# Patient Record
Sex: Male | Born: 1975 | Race: White | Hispanic: No | Marital: Single | Smoking: Never smoker
Health system: Southern US, Community
[De-identification: ages and names within clinical notes are randomized; demographics above are authoritative.]

## PROBLEM LIST (undated history)

## (undated) DIAGNOSIS — F319 Bipolar disorder, unspecified: Secondary | ICD-10-CM

## (undated) DIAGNOSIS — R569 Unspecified convulsions: Secondary | ICD-10-CM

---

## 2017-03-08 ENCOUNTER — Inpatient Hospital Stay (HOSPITAL_COMMUNITY)
Admission: EM | Admit: 2017-03-08 | Discharge: 2017-03-14 | DRG: 101 | Disposition: A | Discharge status: Home / Self Care | Payer: Self-pay | Attending: Family Medicine | Admitting: Family Medicine

## 2017-03-08 ENCOUNTER — Encounter (HOSPITAL_COMMUNITY): Payer: Self-pay | Admitting: Emergency Medicine

## 2017-03-08 ENCOUNTER — Emergency Department (HOSPITAL_COMMUNITY): Payer: Self-pay

## 2017-03-08 DIAGNOSIS — M4802 Spinal stenosis, cervical region: Secondary | ICD-10-CM | POA: Diagnosis present

## 2017-03-08 DIAGNOSIS — Z227 Latent tuberculosis: Secondary | ICD-10-CM

## 2017-03-08 DIAGNOSIS — G9340 Encephalopathy, unspecified: Secondary | ICD-10-CM

## 2017-03-08 DIAGNOSIS — G621 Alcoholic polyneuropathy: Secondary | ICD-10-CM | POA: Diagnosis present

## 2017-03-08 DIAGNOSIS — I471 Supraventricular tachycardia: Secondary | ICD-10-CM | POA: Diagnosis present

## 2017-03-08 DIAGNOSIS — Z9114 Patient's other noncompliance with medication regimen: Secondary | ICD-10-CM

## 2017-03-08 DIAGNOSIS — K7460 Unspecified cirrhosis of liver: Secondary | ICD-10-CM

## 2017-03-08 DIAGNOSIS — F10239 Alcohol dependence with withdrawal, unspecified: Secondary | ICD-10-CM | POA: Diagnosis present

## 2017-03-08 DIAGNOSIS — R7611 Nonspecific reaction to tuberculin skin test without active tuberculosis: Secondary | ICD-10-CM | POA: Diagnosis present

## 2017-03-08 DIAGNOSIS — F10230 Alcohol dependence with withdrawal, uncomplicated: Secondary | ICD-10-CM

## 2017-03-08 DIAGNOSIS — F319 Bipolar disorder, unspecified: Secondary | ICD-10-CM | POA: Diagnosis present

## 2017-03-08 DIAGNOSIS — K7469 Other cirrhosis of liver: Secondary | ICD-10-CM

## 2017-03-08 DIAGNOSIS — G629 Polyneuropathy, unspecified: Secondary | ICD-10-CM

## 2017-03-08 DIAGNOSIS — Z79899 Other long term (current) drug therapy: Secondary | ICD-10-CM

## 2017-03-08 DIAGNOSIS — F410 Panic disorder [episodic paroxysmal anxiety] without agoraphobia: Secondary | ICD-10-CM | POA: Diagnosis not present

## 2017-03-08 DIAGNOSIS — D696 Thrombocytopenia, unspecified: Secondary | ICD-10-CM

## 2017-03-08 DIAGNOSIS — E871 Hypo-osmolality and hyponatremia: Secondary | ICD-10-CM | POA: Diagnosis present

## 2017-03-08 DIAGNOSIS — G40509 Epileptic seizures related to external causes, not intractable, without status epilepticus: Principal | ICD-10-CM | POA: Diagnosis present

## 2017-03-08 DIAGNOSIS — E7220 Disorder of urea cycle metabolism, unspecified: Secondary | ICD-10-CM

## 2017-03-08 DIAGNOSIS — F10930 Alcohol use, unspecified with withdrawal, uncomplicated: Secondary | ICD-10-CM

## 2017-03-08 DIAGNOSIS — R569 Unspecified convulsions: Secondary | ICD-10-CM

## 2017-03-08 DIAGNOSIS — Z23 Encounter for immunization: Secondary | ICD-10-CM

## 2017-03-08 DIAGNOSIS — K7030 Alcoholic cirrhosis of liver without ascites: Secondary | ICD-10-CM

## 2017-03-08 DIAGNOSIS — F10129 Alcohol abuse with intoxication, unspecified: Secondary | ICD-10-CM

## 2017-03-08 DIAGNOSIS — Z87820 Personal history of traumatic brain injury: Secondary | ICD-10-CM

## 2017-03-08 DIAGNOSIS — Z590 Homelessness: Secondary | ICD-10-CM

## 2017-03-08 DIAGNOSIS — D61818 Other pancytopenia: Secondary | ICD-10-CM | POA: Diagnosis present

## 2017-03-08 HISTORY — DX: Bipolar disorder, unspecified: F31.9

## 2017-03-08 HISTORY — DX: Unspecified convulsions: R56.9

## 2017-03-08 LAB — CBC
HCT: 38.6 % — ABNORMAL LOW (ref 39.0–52.0)
HCT: 36.4 % — ABNORMAL LOW (ref 39.0–52.0)
HEMOGLOBIN: 13.4 g/dL (ref 13.0–17.0)
HEMOGLOBIN: 12.4 g/dL — AB (ref 13.0–17.0)
MCH: 32.4 pg (ref 26.0–34.0)
MCH: 32 pg (ref 26.0–34.0)
MCHC: 34.7 g/dL (ref 30.0–36.0)
MCHC: 34.1 g/dL (ref 30.0–36.0)
MCV: 93.2 fL (ref 78.0–100.0)
MCV: 93.8 fL (ref 78.0–100.0)
Platelets: 44 10*3/uL — ABNORMAL LOW (ref 150–400)
Platelets: 32 10*3/uL — ABNORMAL LOW (ref 150–400)
RBC: 4.14 MIL/uL — AB (ref 4.22–5.81)
RBC: 3.88 MIL/uL — AB (ref 4.22–5.81)
RDW: 15.4 % (ref 11.5–15.5)
RDW: 15.4 % (ref 11.5–15.5)
WBC: 3.8 10*3/uL — AB (ref 4.0–10.5)
WBC: 2.4 10*3/uL — ABNORMAL LOW (ref 4.0–10.5)

## 2017-03-08 LAB — COMPREHENSIVE METABOLIC PANEL
ALT: 105 U/L — AB (ref 17–63)
AST: 198 U/L — ABNORMAL HIGH (ref 15–41)
Albumin: 4 g/dL (ref 3.5–5.0)
Alkaline Phosphatase: 253 U/L — ABNORMAL HIGH (ref 38–126)
Anion gap: 11 (ref 5–15)
BILIRUBIN TOTAL: 1.8 mg/dL — AB (ref 0.3–1.2)
CO2: 24 mmol/L (ref 22–32)
CREATININE: 0.59 mg/dL — AB (ref 0.61–1.24)
Calcium: 8.5 mg/dL — ABNORMAL LOW (ref 8.9–10.3)
Chloride: 99 mmol/L — ABNORMAL LOW (ref 101–111)
GFR calc Af Amer: >60 mL/min (ref >60)
GFR calc non Af Amer: >60 mL/min (ref >60)
Glucose, Bld: 102 mg/dL — ABNORMAL HIGH (ref 65–99)
Potassium: 3.9 mmol/L (ref 3.5–5.1)
Sodium: 134 mmol/L — ABNORMAL LOW (ref 135–145)
TOTAL PROTEIN: 7 g/dL (ref 6.5–8.1)

## 2017-03-08 LAB — CREATININE, SERUM
CREATININE: 0.58 mg/dL — AB (ref 0.61–1.24)
GFR calc Af Amer: >60 mL/min (ref >60)

## 2017-03-08 LAB — MAGNESIUM
MAGNESIUM: 1.8 mg/dL (ref 1.7–2.4)
MAGNESIUM: 1.7 mg/dL (ref 1.7–2.4)

## 2017-03-08 LAB — CK: Total CK: 193 U/L (ref 49–397)

## 2017-03-08 LAB — PHOSPHORUS
PHOSPHORUS: 4.8 mg/dL — AB (ref 2.5–4.6)
PHOSPHORUS: 4.7 mg/dL — AB (ref 2.5–4.6)

## 2017-03-08 LAB — VITAMIN B12: Vitamin B-12: 519 pg/mL (ref 180–914)

## 2017-03-08 LAB — PROTIME-INR
INR: 1.14
Prothrombin Time: 14.6 seconds (ref 11.4–15.2)

## 2017-03-08 LAB — TROPONIN I: Troponin I: <0.03 ng/mL (ref <0.03)

## 2017-03-08 LAB — BRAIN NATRIURETIC PEPTIDE: B Natriuretic Peptide: 18 pg/mL (ref 0.0–100.0)

## 2017-03-08 LAB — AMMONIA: Ammonia: 83 umol/L — ABNORMAL HIGH (ref 9–35)

## 2017-03-08 LAB — LIPASE, BLOOD: LIPASE: 31 U/L (ref 11–51)

## 2017-03-08 LAB — CBG MONITORING, ED: Glucose-Capillary: 113 mg/dL — ABNORMAL HIGH (ref 65–99)

## 2017-03-08 LAB — ETHANOL: Alcohol, Ethyl (B): 522 mg/dL (ref <5)

## 2017-03-08 LAB — MRSA PCR SCREENING: MRSA BY PCR: NEGATIVE

## 2017-03-08 MED ORDER — ADULT MULTIVITAMIN W/MINERALS CH
1.0000 | ORAL_TABLET | Freq: Every day | ORAL | Status: DC
  Administered 2017-03-09 – 2017-03-14 (×6): 1 via ORAL
  Filled 2017-03-08 (×6): qty 1

## 2017-03-08 MED ORDER — VITAMIN B-1 100 MG PO TABS: 100.0000 mg | ORAL_TABLET | Freq: Every day | ORAL | Status: DC

## 2017-03-08 MED ORDER — LORAZEPAM 2 MG/ML IJ SOLN
2.0000 mg | INTRAMUSCULAR | Status: DC | PRN
  Administered 2017-03-08 – 2017-03-14 (×23): 2 mg via INTRAVENOUS
  Filled 2017-03-08 (×25): qty 1

## 2017-03-08 MED ORDER — M.V.I. ADULT IV INJ
INJECTION | Freq: Once | INTRAVENOUS | Status: AC
  Administered 2017-03-08: 15:00:00 via INTRAVENOUS
  Filled 2017-03-08 (×2): qty 1000

## 2017-03-08 MED ORDER — KCL IN DEXTROSE-NACL 20-5-0.45 MEQ/L-%-% IV SOLN
INTRAVENOUS | Status: DC
  Administered 2017-03-08: 18:00:00 via INTRAVENOUS
  Filled 2017-03-08: qty 1000

## 2017-03-08 MED ORDER — LACTULOSE 10 GM/15ML PO SOLN
30.0000 g | Freq: Once | ORAL | Status: AC
  Administered 2017-03-08: 30 g via ORAL
  Filled 2017-03-08 (×2): qty 45

## 2017-03-08 MED ORDER — FOLIC ACID 5 MG/ML IJ SOLN
1.0000 mg | Freq: Every day | INTRAMUSCULAR | Status: DC
  Administered 2017-03-08: 1 mg via INTRAVENOUS
  Filled 2017-03-08: qty 0.2

## 2017-03-08 MED ORDER — THIAMINE HCL 100 MG/ML IJ SOLN
100.0000 mg | Freq: Every day | INTRAMUSCULAR | Status: DC
  Administered 2017-03-08: 100 mg via INTRAVENOUS
  Filled 2017-03-08: qty 2

## 2017-03-08 MED ORDER — PNEUMOCOCCAL VAC POLYVALENT 25 MCG/0.5ML IJ INJ
0.5000 mL | INJECTION | INTRAMUSCULAR | Status: AC
  Administered 2017-03-09: 0.5 mL via INTRAMUSCULAR
  Filled 2017-03-08: qty 0.5

## 2017-03-08 MED ORDER — SODIUM CHLORIDE 0.9 % IV SOLN
500.0000 mg | Freq: Two times a day (BID) | INTRAVENOUS | Status: DC
  Administered 2017-03-09 – 2017-03-14 (×11): 500 mg via INTRAVENOUS
  Filled 2017-03-08 (×14): qty 5

## 2017-03-08 MED ORDER — POTASSIUM CHLORIDE IN NACL 20-0.9 MEQ/L-% IV SOLN
INTRAVENOUS | Status: DC
  Administered 2017-03-08 – 2017-03-11 (×2): via INTRAVENOUS
  Filled 2017-03-08 (×2): qty 1000

## 2017-03-08 MED ORDER — FOLIC ACID 1 MG PO TABS: 1.0000 mg | ORAL_TABLET | Freq: Every day | ORAL | Status: DC

## 2017-03-08 MED ORDER — FOLIC ACID 1 MG PO TABS
1.0000 mg | ORAL_TABLET | Freq: Every day | ORAL | Status: DC
  Administered 2017-03-09 – 2017-03-14 (×6): 1 mg via ORAL
  Filled 2017-03-08 (×6): qty 1

## 2017-03-08 MED ORDER — VITAMIN B-1 100 MG PO TABS
100.0000 mg | ORAL_TABLET | Freq: Every day | ORAL | Status: DC
  Administered 2017-03-10 – 2017-03-14 (×5): 100 mg via ORAL
  Filled 2017-03-08 (×5): qty 1

## 2017-03-08 MED ORDER — LORAZEPAM 2 MG/ML IJ SOLN
1.0000 mg | Freq: Once | INTRAMUSCULAR | Status: AC
  Administered 2017-03-08: 1 mg via INTRAVENOUS
  Filled 2017-03-08: qty 1

## 2017-03-08 MED ORDER — THIAMINE HCL 100 MG/ML IJ SOLN: 100.0000 mg | Freq: Every day | INTRAMUSCULAR | Status: DC

## 2017-03-08 MED ORDER — SODIUM CHLORIDE 0.9 % IV SOLN
500.0000 mg | Freq: Once | INTRAVENOUS | Status: AC
  Administered 2017-03-08: 500 mg via INTRAVENOUS
  Filled 2017-03-08: qty 5

## 2017-03-08 MED ORDER — ENOXAPARIN SODIUM 40 MG/0.4ML ~~LOC~~ SOLN: 40.0000 mg | SUBCUTANEOUS | Status: DC

## 2017-03-08 MED ORDER — THIAMINE HCL 100 MG/ML IJ SOLN
100.0000 mg | Freq: Every day | INTRAMUSCULAR | Status: DC
  Administered 2017-03-09: 100 mg via INTRAVENOUS
  Filled 2017-03-08 (×2): qty 2

## 2017-03-08 MED ORDER — FOLIC ACID 5 MG/ML IJ SOLN: 1.0000 mg | Freq: Every day | INTRAMUSCULAR | Status: DC

## 2017-03-08 MED ORDER — LACTULOSE 10 GM/15ML PO SOLN
30.0000 g | Freq: Three times a day (TID) | ORAL | Status: DC
  Administered 2017-03-08 – 2017-03-11 (×9): 30 g via ORAL
  Filled 2017-03-08 (×13): qty 45

## 2017-03-08 MED ORDER — FOLIC ACID 5 MG/ML IJ SOLN
1.0000 mg | Freq: Every day | INTRAMUSCULAR | Status: DC
  Administered 2017-03-08: 1 mg via INTRAVENOUS
  Filled 2017-03-08 (×7): qty 0.2

## 2017-03-08 NOTE — ED Notes (Signed)
Patient transported to CT 

## 2017-03-08 NOTE — ED Provider Notes (Signed)
[facility]-EMERGENCY DEPT Provider Note   CSN: [id number] Arrival date & time: 03/08/17  1145     History   Chief Complaint Chief Complaint  Patient presents with  . Fall  . Near Syncope    HPI Alex Klein. is a 41 y.o. male.  HPI   41 year old male with history of bipolar disorder and seizure disorder here with witnessed suspected seizure. History is limited at this time due to altered mental status. Per EMS report, there were called to the scene as the patient was talking with a friend then reportedly fell to the ground shaking. He was minimally responsive on EMS arrival and has been increasingly awake since then. On my assessment, patient just states he feels "shaky" but is unable or unwilling to provide further history.  Level 5 caveat invoked as remainder of history, ROS, and physical exam limited due to patient's AMS.   Past Medical History:  Diagnosis Date  . Bipolar 1 disorder (HCC)   . Seizures [facility])     Patient Active Problem List   Diagnosis Date Noted  . Seizure-like activity (HCC) 03/08/2017    History reviewed. No pertinent surgical history.     Home Medications    Prior to Admission medications   Not on File    Family History History reviewed. No pertinent family history.  Social History Social History  Substance Use Topics  . Smoking status: Never Smoker  . Smokeless tobacco: Never Used  . Alcohol use 10.8 oz/week    18 Cans of beer per week     Allergies   Patient has no known allergies.   Review of Systems Review of Systems  Unable to perform ROS: Mental status change     Physical Exam Updated Vital Signs BP (!) 125/99 (BP Location: Right Arm)   Pulse 86   Temp 97.9 F (36.6 C) (Oral)   Resp 17   Ht 6\' 2"  (1.88 m)   Wt 107.3 kg (236 lb 9.6 oz)   SpO2 94%   BMI 30.38 kg/m   Physical Exam   ED Treatments / Results  Labs (all labs ordered are listed, but only abnormal results are displayed) Labs Reviewed    CBC - Abnormal; Notable for the following:       Result Value   WBC 3.8 (*)    RBC 4.14 (*)    HCT 38.6 (*)    Platelets 44 (*)    All other components within normal limits  COMPREHENSIVE METABOLIC PANEL - Abnormal; Notable for the following:    Sodium 134 (*)    Chloride 99 (*)    Glucose, Bld 102 (*)    BUN <5 (*)    Creatinine, Ser 0.59 (*)    Calcium 8.5 (*)    AST 198 (*)    ALT 105 (*)    Alkaline Phosphatase 253 (*)    Total Bilirubin 1.8 (*)    All other components within normal limits  ETHANOL - Abnormal; Notable for the following:    Alcohol, Ethyl (B) 522 (*)    All other components within normal limits  AMMONIA - Abnormal; Notable for the following:    Ammonia 83 (*)    All other components within normal limits  CBG MONITORING, ED - Abnormal; Notable for the following:    Glucose-Capillary 113 (*)    All other components within normal limits  MRSA PCR SCREENING  MAGNESIUM  URINALYSIS, ROUTINE W REFLEX MICROSCOPIC  RAPID URINE DRUG SCREEN, HOSP  PERFORMED  HIV ANTIBODY (ROUTINE TESTING)  CBC  CREATININE, SERUM  MAGNESIUM  PHOSPHORUS  PROTIME-INR  TROPONIN I  COMPREHENSIVE METABOLIC PANEL  PROTIME-INR  CBC    EKG  EKG Interpretation  Date/Time:  Sunday Mar 08 2017 11:55:06 EDT Ventricular Rate:  97 PR Interval:    QRS Duration: 94 QT Interval:  325 QTC Calculation: 413 R Axis:   37 Text Interpretation:  Sinus rhythm Borderline T abnormalities, diffuse leads Minimal ST elevation, anterior leads No old tracings to compare Diffuse ST changes, concerning for LVH versus demand iscehmia Confirmed by Shaune Pollack [phone]) on 03/08/2017 6:25:02 PM       Radiology Dg Chest 2 View  Result Date: 03/08/2017 CLINICAL DATA:  Under witnessed fall with loss of consciousness. History of seizures and ethanol abuse. EXAM: CHEST  2 VIEW COMPARISON:  None. FINDINGS: Low lung volumes with resulting mild vascular crowding and atelectasis at both lung bases. The  heart size and mediastinal contours are normal. The lungs are otherwise clear. There is no pleural effusion or pneumothorax. No acute osseous findings are seen. There is probable posttraumatic deformity of the distal right clavicle. IMPRESSION: Suboptimal inspiration.  No acute cardiopulmonary process. Electronically Signed   By: Carey Bullocks M.D.   On: 03/08/2017 13:32   Ct Head Wo Contrast  Result Date: 03/08/2017 CLINICAL DATA:  41 year old male with a history of seizure and fall EXAM: CT HEAD WITHOUT CONTRAST CT CERVICAL SPINE WITHOUT CONTRAST TECHNIQUE: Multidetector CT imaging of the head and cervical spine was performed following the standard protocol without intravenous contrast. Multiplanar CT image reconstructions of the cervical spine were also generated. COMPARISON:  None. FINDINGS: CT HEAD FINDINGS Brain: No acute intracranial hemorrhage. No midline shift or mass effect. Gray-white differentiation maintained. Unremarkable appearance of the ventricular system. Vascular: No significant vascular calcifications. Skull: No acute fracture.  No aggressive bone lesion identified. Sinuses/Orbits: Unremarkable appearance of the orbits. Mastoid air cells clear. No middle ear effusion. No significant sinus disease. Other: None CT CERVICAL SPINE FINDINGS Alignment: Craniocervical junction aligned. Anatomic alignment of the cervical elements. No subluxation. Skull base and vertebrae: No acute fracture at the skullbase. Vertebral body heights relatively maintained. No acute fracture identified. Soft tissues and spinal canal: Unremarkable cervical soft tissues. Lymph nodes are present, though not enlarged. Disc levels: Degenerative disc disease worst at C6-C7 with uncovertebral joint disease, endplate changes, anterior osteophyte production. No bony canal narrowing. Upper chest: Unremarkable appearance of the lung apices. Other: No bony canal narrowing. IMPRESSION: Head CT: No CT evidence of acute intracranial  abnormality. Cervical CT: No CT evidence of acute fracture or malalignment of the cervical spine. Early degenerative disc disease worst at the C6-C7 level. Electronically Signed   By: Gilmer Mor D.O.   On: 03/08/2017 14:14   Ct Cervical Spine Wo Contrast  Result Date: 03/08/2017 CLINICAL DATA:  41 year old male with a history of seizure and fall EXAM: CT HEAD WITHOUT CONTRAST CT CERVICAL SPINE WITHOUT CONTRAST TECHNIQUE: Multidetector CT imaging of the head and cervical spine was performed following the standard protocol without intravenous contrast. Multiplanar CT image reconstructions of the cervical spine were also generated. COMPARISON:  None. FINDINGS: CT HEAD FINDINGS Brain: No acute intracranial hemorrhage. No midline shift or mass effect. Gray-white differentiation maintained. Unremarkable appearance of the ventricular system. Vascular: No significant vascular calcifications. Skull: No acute fracture.  No aggressive bone lesion identified. Sinuses/Orbits: Unremarkable appearance of the orbits. Mastoid air cells clear. No middle ear effusion. No significant sinus disease. Other:  None CT CERVICAL SPINE FINDINGS Alignment: Craniocervical junction aligned. Anatomic alignment of the cervical elements. No subluxation. Skull base and vertebrae: No acute fracture at the skullbase. Vertebral body heights relatively maintained. No acute fracture identified. Soft tissues and spinal canal: Unremarkable cervical soft tissues. Lymph nodes are present, though not enlarged. Disc levels: Degenerative disc disease worst at C6-C7 with uncovertebral joint disease, endplate changes, anterior osteophyte production. No bony canal narrowing. Upper chest: Unremarkable appearance of the lung apices. Other: No bony canal narrowing. IMPRESSION: Head CT: No CT evidence of acute intracranial abnormality. Cervical CT: No CT evidence of acute fracture or malalignment of the cervical spine. Early degenerative disc disease worst at  the C6-C7 level. Electronically Signed   By: Gilmer MorJaime  Wagner D.O.   On: 03/08/2017 14:14    Procedures Procedures (including critical care time)  Medications Ordered in ED Medications  dextrose 5 % and 0.45 % NaCl with KCl 20 mEq/L infusion ( Intravenous New Bag/Given 03/08/17 1810)  folic acid injection 1 mg (1 mg Intravenous Given 03/08/17 1810)  folic acid (FOLVITE) tablet 1 mg (1 mg Oral Not Given 03/08/17 1811)  thiamine (VITAMIN B-1) tablet 100 mg (100 mg Oral Not Given 03/08/17 1830)  thiamine (B-1) injection 100 mg (100 mg Intravenous Given 03/08/17 1810)  pneumococcal 23 valent vaccine (PNU-IMMUNE) injection 0.5 mL (not administered)  lactulose (CHRONULAC) 10 GM/15ML solution 30 g (not administered)  LORazepam (ATIVAN) injection 1 mg (1 mg Intravenous Given 03/08/17 1340)  sodium chloride 0.9 % 1,000 mL with thiamine 100 mg, folic acid 1 mg, multivitamins adult 10 mL infusion ( Intravenous Stopped 03/08/17 1710)  levETIRAcetam (KEPPRA) 500 mg in sodium chloride 0.9 % 100 mL IVPB (0 mg Intravenous Stopped 03/08/17 1724)  lactulose (CHRONULAC) 10 GM/15ML solution 30 g (30 g Oral Given 03/08/17 1811)     Initial Impression / Assessment and Plan / ED Course  I have reviewed the triage vital signs and the nursing notes.  Pertinent labs & imaging results that were available during my care of the patient were reviewed by me and considered in my medical decision making (see chart for details).    41 year old male with history of chronic alcoholism here with altered mental status in setting of suspected seizure. Labwork shows hyperammonemia, as well as pancytopenia and transaminitis likely secondary to underlying alcoholic cirrhosis. CT head is negative and he has no focal neurological deficits. He does admit to history of seizures and alcohol level is 522, making primary breakthrough seizure more likely than alcohol withdrawal. Given his hyperammonemia, altered mental status, and high risk for  withdrawal, will admit for continued management. Lactulose given. Patient also loaded with Keppra and neurology has been consulted.  Final Clinical Impressions(s) / ED Diagnoses   Final diagnoses:  Seizure-like activity (HCC)  Hyperammonemia (HCC)  Encephalopathy    New Prescriptions There are no discharge medications for this patient.    Shaune PollackIsaacs, Davionne Dowty, MD 03/08/17 [phone]

## 2017-03-08 NOTE — ED Notes (Signed)
CRITICAL VALUE ALERT  Critical Value: ETOH 525

## 2017-03-08 NOTE — H&P (Signed)
Family Medicine Teaching [facility] Admission History and Physical Service Pager: [phone]  Patient name: Alex Klein. Medical record number: [id number] Date of birth: Jul 30, 1976 Age: 41 y.o. Gender: male  Primary Care Provider: Patient, No Pcp Per Consultants: neurology Code Status: full (not reviewed with patient on admission due to his mental status)  Chief Complaint: Seizure-like activity  Assessment and Plan: Alex Parkerson. is a 41 y.o. male presenting with seizure like activity . PMH is significant for alcoholic cirrhosis, ? Alex Klein, homelessness  Seizure-like activity: Shaking episode witnessed by friends per EMS report to ED provider. Reports history of seizure. However, he says he is on trazodone, gabapentin and Abilify for this. He is groggy likely from alcohol versus postictal. No signs of tongue bite. CT head and neck without acute finding. Initial EKG without acute finding.  Neurology consulted by ED provider in ED and recommended starting Keppra 500 mg twice a day.  -Admit to SDU. Attending Dr. Randolm Idol -Appreciate neuro recs -Continue Keppra 500 mg twice a day -EEG -CK and prolactin level.  -Troponin 1  Alcohol intoxication: EtOH elevated to 522 with transaminitis consistent with this.  - Stepdown CIWA protocol  - Check phosphorus and Mg level - Vitamin B12 and folate levels  Hyponatremia: Na 134. Beer potomania -NS+KCl20@75   Abdominal pain/alcoholic cirrhosis: No signs of ascites on exam. He is diffusely tender to palpation but no Murphy signs or rebound. Ammonia level elevated to 83. Blood synthetic function with normal albumin and PT/INR. Alkaline phosphatase elevated to 250's. Transaminitis in alcoholic pattern. No fever or leukocytosis to suspect SBP.  -Abdominal ultrasound -GGT - Lipase -Lactulose 30g 3 times a day -Hepatitis panel -HIV/RPR/QuantiFERON gold  Thrombocytopenia: Platelets low at 41 on admission. No baseline in  chart. Likely splenic sequestration from his cirrhosis. 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 He also mild leukopenia to 3.8. -SCD -Daily CBC  Leukopenia: WBC 3.8 on admission. Likely due to alcohol. Hemoglobin 13.4, with mildly elevated MCV to 93% -Daily CBC  Homelessness:  -Child psychotherapist consulted  FEN/GI: -Regular diet  Prophylaxis: SCD  Disposition: admit to stepdown  History of Present Illness:  Alex Steiner. is a 41 y.o. male presenting with seizure-like activity.   Patient reports having a shaking spells prior to presentation to ED. Doesn't remember the exact time. "Per EMS report, they were called to the scene as the patient was talking with a friend then reportedly fell to the ground shaking. He was minimally responsive on EMS arrival and has been increasingly awake since then". Patient admits history of seizure. He states he is on trazodone, gabapentin, Abilify for seizure. He reports getting his medications from health Department. He also admits drinking 3 of the 12 ounce beer this morning. He denies headache, chest pain, shortness of breath, nausea, vomiting, fever, chills or dysuria.  He reports history of alcoholic cirrhosis and neuropathy.  He denies smoking or recreational drug use. He says he is homeless.   I was not able to elicit further history due to his mental status  EC course: Vital signs significant for BP to 145/94. CMP with sodium to 134, alkaline phosphatase to 253, AST 198 and ALT 105. Ammonia 83. WBC 3.8. Platelet 44. Alcohol level 522. EKG normal. CXR with falsely enlarged cardiac silhouette likely due to poor inspiratory effort, CT head and neck without acute finding. Neurology was consulted by ED and recommended starting Keppra 500 mg twice a day.   Review Of Systems:   ROS ROS  was limited due to patient's AMS Patient Active Problem List   Diagnosis Date Noted  . Seizure-like activity (HCC) 03/08/2017    Past Medical History: Past  Medical History:  Diagnosis Date  . Bipolar 1 Klein (HCC)   . Seizures (HCC)     Past Surgical History: History reviewed. No pertinent surgical history.  Social History: Social History  Substance Use Topics  . Smoking status: Never Smoker  . Smokeless tobacco: Never Used  . Alcohol use 10.8 oz/week    18 Cans of beer per week   Additional social history: See history of present illness  Please also refer to relevant sections of EMR.  Family History: History reviewed. No pertinent family history. (If not completed, MUST add something in)  Allergies and Medications: No Known Allergies No current facility-administered medications on file prior to encounter.    No current outpatient prescriptions on file prior to encounter.    Objective: BP (!) 125/99 (BP Location: Right Arm)   Pulse 86   Temp 97.9 F (36.6 C) (Oral)   Resp 17   Ht 6\' 2"  (1.88 m)   Wt 236 lb 9.6 oz (107.3 kg)   SpO2 94%   BMI 30.38 kg/m  Exam: GEN: appears sleepy, rose to his name but groggy Head: normocephalic and atraumatic  Eyes: conjunctiva without injection, sclera anicteric Oropharynx: mmm , couldn't cooperate to assess him further in to his throat.  HEM: negative for cervical or periauricular lymphadenopathies CVS: RRR, nl s1 & s2, no murmurs, no edema,  2+ DP & PT pulses bilaterally RESP: no IWOB, good air movement bilaterally, CTAB GI: BS present & normal, soft, mild diffuse tenderness to palpation all over, negative Murphy sign GU: no suprapubic or CVA tenderness MSK: no focal tenderness or notable swelling SKIN: no apparent skin lesion NEURO: Sleepy, rose to his name. Cranial nerves grossly intact. Motor 5/5 in all extremities. Light sensation grossly intact  Labs and Imaging: CBC BMET   Recent Labs Lab 03/08/17 1224  WBC 3.8*  HGB 13.4  HCT 38.6*  PLT 44*    Recent Labs Lab 03/08/17 1224  NA 134*  K 3.9  CL 99*  CO2 24  BUN <5*  CREATININE 0.59*  GLUCOSE 102*   CALCIUM 8.5*     Dg Chest 2 View  Result Date: 03/08/2017 CLINICAL DATA:  Under witnessed fall with loss of consciousness. History of seizures and ethanol abuse. EXAM: CHEST  2 VIEW COMPARISON:  None. FINDINGS: Low lung volumes with resulting mild vascular crowding and atelectasis at both lung bases. The heart size and mediastinal contours are normal. The lungs are otherwise clear. There is no pleural effusion or pneumothorax. No acute osseous findings are seen. There is probable posttraumatic deformity of the distal right clavicle. IMPRESSION: Suboptimal inspiration.  No acute cardiopulmonary process. Electronically Signed   By: Carey BullocksWilliam  Veazey M.D.   On: 03/08/2017 13:32   Ct Head Wo Contrast  Result Date: 03/08/2017 CLINICAL DATA:  41 year old male with a history of seizure and fall EXAM: CT HEAD WITHOUT CONTRAST CT CERVICAL SPINE WITHOUT CONTRAST TECHNIQUE: Multidetector CT imaging of the head and cervical spine was performed following the standard protocol without intravenous contrast. Multiplanar CT image reconstructions of the cervical spine were also generated. COMPARISON:  None. FINDINGS: CT HEAD FINDINGS Brain: No acute intracranial hemorrhage. No midline shift or mass effect. Gray-white differentiation maintained. Unremarkable appearance of the ventricular system. Vascular: No significant vascular calcifications. Skull: No acute fracture.  No aggressive bone lesion  identified. Sinuses/Orbits: Unremarkable appearance of the orbits. Mastoid air cells clear. No middle ear effusion. No significant sinus disease. Other: None CT CERVICAL SPINE FINDINGS Alignment: Craniocervical junction aligned. Anatomic alignment of the cervical elements. No subluxation. Skull base and vertebrae: No acute fracture at the skullbase. Vertebral body heights relatively maintained. No acute fracture identified. Soft tissues and spinal canal: Unremarkable cervical soft tissues. Lymph nodes are present, though not  enlarged. Disc levels: Degenerative disc disease worst at C6-C7 with uncovertebral joint disease, endplate changes, anterior osteophyte production. No bony canal narrowing. Upper chest: Unremarkable appearance of the lung apices. Other: No bony canal narrowing. IMPRESSION: Head CT: No CT evidence of acute intracranial abnormality. Cervical CT: No CT evidence of acute fracture or malalignment of the cervical spine. Early degenerative disc disease worst at the C6-C7 level. Electronically Signed   By: Gilmer Mor D.O.   On: 03/08/2017 14:14   Ct Cervical Spine Wo Contrast  Result Date: 03/08/2017 CLINICAL DATA:  41 year old male with a history of seizure and fall EXAM: CT HEAD WITHOUT CONTRAST CT CERVICAL SPINE WITHOUT CONTRAST TECHNIQUE: Multidetector CT imaging of the head and cervical spine was performed following the standard protocol without intravenous contrast. Multiplanar CT image reconstructions of the cervical spine were also generated. COMPARISON:  None. FINDINGS: CT HEAD FINDINGS Brain: No acute intracranial hemorrhage. No midline shift or mass effect. Gray-white differentiation maintained. Unremarkable appearance of the ventricular system. Vascular: No significant vascular calcifications. Skull: No acute fracture.  No aggressive bone lesion identified. Sinuses/Orbits: Unremarkable appearance of the orbits. Mastoid air cells clear. No middle ear effusion. No significant sinus disease. Other: None CT CERVICAL SPINE FINDINGS Alignment: Craniocervical junction aligned. Anatomic alignment of the cervical elements. No subluxation. Skull base and vertebrae: No acute fracture at the skullbase. Vertebral body heights relatively maintained. No acute fracture identified. Soft tissues and spinal canal: Unremarkable cervical soft tissues. Lymph nodes are present, though not enlarged. Disc levels: Degenerative disc disease worst at C6-C7 with uncovertebral joint disease, endplate changes, anterior osteophyte  production. No bony canal narrowing. Upper chest: Unremarkable appearance of the lung apices. Other: No bony canal narrowing. IMPRESSION: Head CT: No CT evidence of acute intracranial abnormality. Cervical CT: No CT evidence of acute fracture or malalignment of the cervical spine. Early degenerative disc disease worst at the C6-C7 level. Electronically Signed   By: Gilmer Mor D.O.   On: 03/08/2017 14:14    Almon Hercules, MD 03/08/2017, 6:17 PM PGY-2, [facility] FPTS Intern pager: [phone], text pages welcome

## 2017-03-08 NOTE — ED Notes (Signed)
Patient transported to X-ray 

## 2017-03-08 NOTE — ED Triage Notes (Signed)
Pt brought to ED by [facility] from home after having un witness fall, per friends pt had a small time LOC, per pt he has a hx of seizures, very unsteady  Gait on EMS arrival pt is a ETOH abuser states had 18 cans of beer today, having abd pain for the past 3 weeks, no taking his daily medication for seizures. cbg 164, bp 126/87, HR 96, spo2 97% on RA.

## 2017-03-09 ENCOUNTER — Inpatient Hospital Stay (HOSPITAL_COMMUNITY): Payer: Self-pay

## 2017-03-09 DIAGNOSIS — K7030 Alcoholic cirrhosis of liver without ascites: Secondary | ICD-10-CM

## 2017-03-09 DIAGNOSIS — D696 Thrombocytopenia, unspecified: Secondary | ICD-10-CM

## 2017-03-09 DIAGNOSIS — R569 Unspecified convulsions: Secondary | ICD-10-CM

## 2017-03-09 DIAGNOSIS — G9340 Encephalopathy, unspecified: Secondary | ICD-10-CM

## 2017-03-09 LAB — COMPREHENSIVE METABOLIC PANEL
ALBUMIN: 3.5 g/dL (ref 3.5–5.0)
ALT: 97 U/L — ABNORMAL HIGH (ref 17–63)
ANION GAP: 9 (ref 5–15)
AST: 227 U/L — ABNORMAL HIGH (ref 15–41)
Alkaline Phosphatase: 218 U/L — ABNORMAL HIGH (ref 38–126)
BUN: <5 mg/dL — ABNORMAL LOW (ref 6–20)
CO2: 24 mmol/L (ref 22–32)
Calcium: 8 mg/dL — ABNORMAL LOW (ref 8.9–10.3)
Chloride: 110 mmol/L (ref 101–111)
Creatinine, Ser: 0.62 mg/dL (ref 0.61–1.24)
GFR calc non Af Amer: >60 mL/min (ref >60)
GLUCOSE: 90 mg/dL (ref 65–99)
POTASSIUM: 3.7 mmol/L (ref 3.5–5.1)
Sodium: 143 mmol/L (ref 135–145)
Total Bilirubin: 2 mg/dL — ABNORMAL HIGH (ref 0.3–1.2)
Total Protein: 6.5 g/dL (ref 6.5–8.1)

## 2017-03-09 LAB — URINALYSIS, ROUTINE W REFLEX MICROSCOPIC
Bilirubin Urine: NEGATIVE
Glucose, UA: NEGATIVE mg/dL
Hgb urine dipstick: NEGATIVE
Ketones, ur: NEGATIVE mg/dL
Leukocytes, UA: NEGATIVE
Nitrite: NEGATIVE
Protein, ur: NEGATIVE mg/dL
Specific Gravity, Urine: 1.004 — ABNORMAL LOW (ref 1.005–1.030)
pH: 5 (ref 5.0–8.0)

## 2017-03-09 LAB — RAPID URINE DRUG SCREEN, HOSP PERFORMED
AMPHETAMINES: NOT DETECTED
Barbiturates: NOT DETECTED
Benzodiazepines: NOT DETECTED
Cocaine: NOT DETECTED
OPIATES: NOT DETECTED
Tetrahydrocannabinol: NOT DETECTED

## 2017-03-09 LAB — GLUCOSE, CAPILLARY
GLUCOSE-CAPILLARY: 85 mg/dL (ref 65–99)
Glucose-Capillary: 107 mg/dL — ABNORMAL HIGH (ref 65–99)
Glucose-Capillary: 92 mg/dL (ref 65–99)

## 2017-03-09 LAB — PROTIME-INR
INR: 1.1
Prothrombin Time: 14.2 seconds (ref 11.4–15.2)

## 2017-03-09 LAB — HEPATITIS PANEL, ACUTE
HEP B S AG: NEGATIVE
Hep A IgM: NEGATIVE
Hep B C IgM: NEGATIVE

## 2017-03-09 LAB — CBC
HCT: 35.5 % — ABNORMAL LOW (ref 39.0–52.0)
Hemoglobin: 12.3 g/dL — ABNORMAL LOW (ref 13.0–17.0)
MCH: 33.2 pg (ref 26.0–34.0)
MCHC: 34.6 g/dL (ref 30.0–36.0)
MCV: 95.7 fL (ref 78.0–100.0)
PLATELETS: 26 10*3/uL — AB (ref 150–400)
RBC: 3.71 MIL/uL — ABNORMAL LOW (ref 4.22–5.81)
RDW: 15.8 % — ABNORMAL HIGH (ref 11.5–15.5)
WBC: 1.8 10*3/uL — AB (ref 4.0–10.5)

## 2017-03-09 LAB — HIV ANTIBODY (ROUTINE TESTING W REFLEX): HIV SCREEN 4TH GENERATION: NONREACTIVE

## 2017-03-09 LAB — PROLACTIN: Prolactin: 19.7 ng/mL — ABNORMAL HIGH (ref 4.0–15.2)

## 2017-03-09 LAB — TSH: TSH: 1.981 u[IU]/mL (ref 0.350–4.500)

## 2017-03-09 MED ORDER — ONDANSETRON HCL 4 MG/2ML IJ SOLN
4.0000 mg | Freq: Four times a day (QID) | INTRAMUSCULAR | Status: DC | PRN
  Administered 2017-03-09 – 2017-03-13 (×2): 4 mg via INTRAVENOUS
  Filled 2017-03-09 (×2): qty 2

## 2017-03-09 MED ORDER — GABAPENTIN 600 MG PO TABS
300.0000 mg | ORAL_TABLET | Freq: Every day | ORAL | Status: DC
  Administered 2017-03-09 – 2017-03-13 (×5): 300 mg via ORAL
  Filled 2017-03-09 (×6): qty 1

## 2017-03-09 NOTE — Procedures (Signed)
History: 41 year old male with known seizure disorder  Sedation: Lorazepam  Technique: This is a 21 channel routine scalp EEG performed at the bedside with bipolar and monopolar montages arranged in accordance to the international 10/20 system of electrode placement. One channel was dedicated to EKG recording.    Background: The background consists of intermixed alpha and beta activities. There is a well defined posterior dominant rhythm of 10 Hz that attenuates with eye opening. Sleep is recorded with normal appearing structures. There is an excess of beta activities.  Photic stimulation: Physiologic driving is not performed  EEG Abnormalities: None  Clinical Interpretation: This normal EEG is recorded in the waking and sleep state. There was no seizure or seizure predisposition recorded on this study. The excess beta activity was likely due to medication effect. Please note that a normal EEG does not preclude the possibility of epilepsy.   Alex SlotMcNeill Abra Lingenfelter, MD [facility] [phone]  If 7pm- 7am, please page neurology on call as listed in AMION.

## 2017-03-09 NOTE — Progress Notes (Signed)
FMTS will continue to monitor platelets, informed them of his ST run hr 133 and CIWA's > 10.
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
## 2017-03-09 NOTE — Progress Notes (Signed)
Family Medicine Teaching Service Daily Progress Note Intern Pager: [phone]  Patient name: Alex RidingBilly Michael Wilmarth Jr. Medical record number: [id number] Date of birth: 1976-05-21 Age: 41 y.o. Gender: male  Primary Care Provider: Patient, No Pcp Per Consultants: neurology Code Status:  Full  Pt Overview and Major Events to Date:  5/27: admitted for seizure like activity  Assessment and Plan: Alex RidingBilly Michael Papania Jr. is a 41 y.o. male presenting with seizure like activity . PMH is significant for alcoholic cirrhosis, alcohol use disorder, homelessness  Seizure-like activity: Shaking episode witnessed by friends per EMS report to ED provider. Reports history of seizure. He states he's had approximately 10 seizures in the past, not all associated with withdrawal.  However, he says he is on trazodone, gabapentin and Abilify for this. He is groggy likely from alcohol versus postictal. No signs of tongue biting. CT head and neck without acute finding. Initial EKG without acute finding. CK 193. Prolactin elevated however I suspect this is secondary to liver dysfunction.  Neurology consulted by ED provider in ED and recommended starting Keppra 500 mg twice a day.  -continue to monitor in SDU -Appreciate neuro recs -Continue Keppra 500 mg twice a day -EEG done, awaiting read - neurology consulted -Troponin 1  Alcohol abuse: EtOH elevated on admission to 522 with transaminitis consistent with alcohol. History of complicated withdrawal.  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 Has h/o neuropathy in feet that is long standing. Suspect this is related to alcohol use. Noted to have degenerative changes most prominent at c6-7 on CT. B12 normal.  -follow up folate/RPR/HIV -will check a TSH - get MRI c-spine without contrast  Hyponatremia: Na 134. Beer  potomania most likely as it is now 143 -NS+KCl20@10cc /hr  Abdominal pain/alcoholic cirrhosis: No signs of ascites on exam. He is diffusely tender to palpation but no Murphy signs or rebound. Ammonia level elevated to 83. Blood synthetic function with normal albumin and PT/INR. Alkaline phosphatase elevated to 250's. Transaminitis in alcoholic pattern. No fever or leukocytosis to suspect SBP.  Abdominal ultrasound consistent with cirrhosis, no other abnormalities. Lipase normal -continue Lactulose 30g 3 times a day - trend LFTs  -Hepatitis panel pending  Thrombocytopenia: Platelets low at 41 on admission, decreased this AM but most likely dilutional given IVFs overnight. No baseline in chart. Likely splenic sequestration from his cirrhosis. 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 He also mild leukopenia to 3.8. No evidence of active bleed.  -SCD - will add on a pathology smear review - CBC this afternoon -Daily CBC  Leukopenia: WBC 3.8 on admission, decrease this AM but most likely dilutional.  Likely due to alcohol. Hemoglobin 13.4, with mildly elevated MCV to 93% -Daily CBC  Homelessness:  -Child psychotherapistocial worker consulted -HIV/RPR/QuantiFERON gold pending   FEN/GI: regular diet PPx: SCD  Disposition: pending seizure work up  Subjective:  Patient continues to have numbness in 4th and 5th fingers bilaterally x 1 month; has stable bilateral foot neuropathy. Sleepy currently. Ativan helping with withdrawal symptoms. Pt states he may have had hallucinations in the past with withdrawal. Has had seizures both during withdrawal and when not going through withdrawal. Drinks ~18 beers per day, denies liquor or wine use.  Objective: Temp:  [97.8 F (36.6 C)-98.8 F (37.1 C)] 98.8 F (37.1 C) (05/28 0300) Pulse Rate:  [76-111] 111 (05/28 0700) Resp:  [  7-21] 21 (05/28 0700) BP: (90-145)/(51-99) 138/86 (05/28 0700) SpO2:  [90 %-97 %] 90 % (05/27 2318) Weight:  [236 lb 9.6 oz (107.3 kg)-250 lb  (113.4 kg)] 236 lb 9.6 oz (107.3 kg) (05/27 1740) Physical Exam: General:  Lying in bed, eyes closed most of the encounter due to drowsiness, answers questions appropriately.  HEENT: R pupil reactive to light, clouding over L eye (stable), MMM, Uvula midline Cardiovascular: RRR, no mr/r/g noted. Non-displaced PMI Respiratory: CTAB, no increased WOB Abdomen: +BS, non-distended, diffusely tender without rebound or guarding Extremities: no edema noted Neuro: Grossly normal CN 2-12 besides pupils as above. 5/5 strength in UE and LE bilaterally (difficult to assess grip due to numbness in 4th and 5th digits bilaterally). Decreased sensation from wrist to 4th and 5th digits bilaterally and the feet bilaterally. +asterixis.  MSK: no spinous process tenderness. Full ROM motion in the neck. Negative Spruling.   Laboratory:  Recent Labs Lab 03/08/17 1224 03/08/17 1816 03/09/17 0234  WBC 3.8* 2.4* 1.8*  HGB 13.4 12.4* 12.3*  HCT 38.6* 36.4* 35.5*  PLT 44* 32* 26*    Recent Labs Lab 03/08/17 1224 03/08/17 1816 03/09/17 0234  NA 134*  --  143  K 3.9  --  3.7  CL 99*  --  110  CO2 24  --  24  BUN <5*  --  <5*  CREATININE 0.59* 0.58* 0.62  CALCIUM 8.5*  --  8.0*  PROT 7.0  --  6.5  BILITOT 1.8*  --  2.0*  ALKPHOS 253*  --  218*  ALT 105*  --  97*  AST 198*  --  227*  GLUCOSE 102*  --  90    Results for orders placed or performed during the hospital encounter of 03/08/17  MRSA PCR Screening  Result Value Ref Range   MRSA by PCR NEGATIVE NEGATIVE  CBC  Result Value Ref Range   WBC 3.8 (L) 4.0 - 10.5 K/uL   RBC 4.14 (L) 4.22 - 5.81 MIL/uL   Hemoglobin 13.4 13.0 - 17.0 g/dL   HCT 16.1 (L) 09.6 - 04.5 %   MCV 93.2 78.0 - 100.0 fL   MCH 32.4 26.0 - 34.0 pg   MCHC 34.7 30.0 - 36.0 g/dL   RDW 40.9 81.1 - 91.4 %   Platelets 44 (L) 150 - 400 K/uL  Urinalysis, Routine w reflex microscopic  Result Value Ref Range   Color, Urine YELLOW YELLOW   APPearance CLEAR CLEAR   Specific  Gravity, Urine 1.004 (L) 1.005 - 1.030   pH 5.0 5.0 - 8.0   Glucose, UA NEGATIVE NEGATIVE mg/dL   Hgb urine dipstick NEGATIVE NEGATIVE   Bilirubin Urine NEGATIVE NEGATIVE   Ketones, ur NEGATIVE NEGATIVE mg/dL   Protein, ur NEGATIVE NEGATIVE mg/dL   Nitrite NEGATIVE NEGATIVE   Leukocytes, UA NEGATIVE NEGATIVE  Comprehensive metabolic panel  Result Value Ref Range   Sodium 134 (L) 135 - 145 mmol/L   Potassium 3.9 3.5 - 5.1 mmol/L   Chloride 99 (L) 101 - 111 mmol/L   CO2 24 22 - 32 mmol/L   Glucose, Bld 102 (H) 65 - 99 mg/dL   BUN <5 (L) 6 - 20 mg/dL   Creatinine, Ser 7.82 (L) 0.61 - 1.24 mg/dL   Calcium 8.5 (L) 8.9 - 10.3 mg/dL   Total Protein 7.0 6.5 - 8.1 g/dL   Albumin 4.0 3.5 - 5.0 g/dL   AST 956 (H) 15 - 41 U/L   ALT 105 (H)  17 - 63 U/L   Alkaline Phosphatase 253 (H) 38 - 126 U/L   Total Bilirubin 1.8 (H) 0.3 - 1.2 mg/dL   GFR calc non Af Amer >60 >60 mL/min   GFR calc Af Amer >60 >60 mL/min   Anion gap 11 5 - 15  Magnesium  Result Value Ref Range   Magnesium 1.8 1.7 - 2.4 mg/dL  Ethanol  Result Value Ref Range   Alcohol, Ethyl (B) 522 (HH) <5 mg/dL  Ammonia  Result Value Ref Range   Ammonia 83 (H) 9 - 35 umol/L  Urine rapid drug screen (hosp performed)  Result Value Ref Range   Opiates NONE DETECTED NONE DETECTED   Cocaine NONE DETECTED NONE DETECTED   Benzodiazepines NONE DETECTED NONE DETECTED   Amphetamines NONE DETECTED NONE DETECTED   Tetrahydrocannabinol NONE DETECTED NONE DETECTED   Barbiturates NONE DETECTED NONE DETECTED  CBC  Result Value Ref Range   WBC 2.4 (L) 4.0 - 10.5 K/uL   RBC 3.88 (L) 4.22 - 5.81 MIL/uL   Hemoglobin 12.4 (L) 13.0 - 17.0 g/dL   HCT 16.1 (L) 09.6 - 04.5 %   MCV 93.8 78.0 - 100.0 fL   MCH 32.0 26.0 - 34.0 pg   MCHC 34.1 30.0 - 36.0 g/dL   RDW 40.9 81.1 - 91.4 %   Platelets 32 (L) 150 - 400 K/uL  Creatinine, serum  Result Value Ref Range   Creatinine, Ser 0.58 (L) 0.61 - 1.24 mg/dL   GFR calc non Af Amer >60 >60 mL/min    GFR calc Af Amer >60 >60 mL/min  Magnesium  Result Value Ref Range   Magnesium 1.7 1.7 - 2.4 mg/dL  Phosphorus  Result Value Ref Range   Phosphorus 4.8 (H) 2.5 - 4.6 mg/dL  Protime-INR  Result Value Ref Range   Prothrombin Time 14.6 11.4 - 15.2 seconds   INR 1.14   Troponin I  Result Value Ref Range   Troponin I <0.03 <0.03 ng/mL  Comprehensive metabolic panel  Result Value Ref Range   Sodium 143 135 - 145 mmol/L   Potassium 3.7 3.5 - 5.1 mmol/L   Chloride 110 101 - 111 mmol/L   CO2 24 22 - 32 mmol/L   Glucose, Bld 90 65 - 99 mg/dL   BUN <5 (L) 6 - 20 mg/dL   Creatinine, Ser 7.82 0.61 - 1.24 mg/dL   Calcium 8.0 (L) 8.9 - 10.3 mg/dL   Total Protein 6.5 6.5 - 8.1 g/dL   Albumin 3.5 3.5 - 5.0 g/dL   AST 956 (H) 15 - 41 U/L   ALT 97 (H) 17 - 63 U/L   Alkaline Phosphatase 218 (H) 38 - 126 U/L   Total Bilirubin 2.0 (H) 0.3 - 1.2 mg/dL   GFR calc non Af Amer >60 >60 mL/min   GFR calc Af Amer >60 >60 mL/min   Anion gap 9 5 - 15  Protime-INR  Result Value Ref Range   Prothrombin Time 14.2 11.4 - 15.2 seconds   INR 1.10   CBC  Result Value Ref Range   WBC 1.8 (L) 4.0 - 10.5 K/uL   RBC 3.71 (L) 4.22 - 5.81 MIL/uL   Hemoglobin 12.3 (L) 13.0 - 17.0 g/dL   HCT 21.3 (L) 08.6 - 57.8 %   MCV 95.7 78.0 - 100.0 fL   MCH 33.2 26.0 - 34.0 pg   MCHC 34.6 30.0 - 36.0 g/dL   RDW 46.9 (H) 62.9 - 52.8 %   Platelets  26 (LL) 150 - 400 K/uL  CK  Result Value Ref Range   Total CK 193 49 - 397 U/L  Prolactin  Result Value Ref Range   Prolactin 19.7 (H) 4.0 - 15.2 ng/mL  Phosphorus  Result Value Ref Range   Phosphorus 4.7 (H) 2.5 - 4.6 mg/dL  Vitamin N82  Result Value Ref Range   Vitamin B-12 519 180 - 914 pg/mL  Brain natriuretic peptide  Result Value Ref Range   B Natriuretic Peptide 18.0 0.0 - 100.0 pg/mL  Lipase, blood  Result Value Ref Range   Lipase 31 11 - 51 U/L  CBG monitoring, ED  Result Value Ref Range   Glucose-Capillary 113 (H) 65 - 99 mg/dL     Imaging/Diagnostic  Tests: Dg Chest 2 View  Result Date: 03/08/2017 CLINICAL DATA:  Under witnessed fall with loss of consciousness. History of seizures and ethanol abuse. EXAM: CHEST  2 VIEW COMPARISON:  None. FINDINGS: Low lung volumes with resulting mild vascular crowding and atelectasis at both lung bases. The heart size and mediastinal contours are normal. The lungs are otherwise clear. There is no pleural effusion or pneumothorax. No acute osseous findings are seen. There is probable posttraumatic deformity of the distal right clavicle. IMPRESSION: Suboptimal inspiration.  No acute cardiopulmonary process. Electronically Signed   By: Carey Bullocks M.D.   On: 03/08/2017 13:32   Ct Head Wo Contrast  Result Date: 03/08/2017 CLINICAL DATA:  41 year old male with a history of seizure and fall EXAM: CT HEAD WITHOUT CONTRAST CT CERVICAL SPINE WITHOUT CONTRAST TECHNIQUE: Multidetector CT imaging of the head and cervical spine was performed following the standard protocol without intravenous contrast. Multiplanar CT image reconstructions of the cervical spine were also generated. COMPARISON:  None. FINDINGS: CT HEAD FINDINGS Brain: No acute intracranial hemorrhage. No midline shift or mass effect. Gray-white differentiation maintained. Unremarkable appearance of the ventricular system. Vascular: No significant vascular calcifications. Skull: No acute fracture.  No aggressive bone lesion identified. Sinuses/Orbits: Unremarkable appearance of the orbits. Mastoid air cells clear. No middle ear effusion. No significant sinus disease. Other: None CT CERVICAL SPINE FINDINGS Alignment: Craniocervical junction aligned. Anatomic alignment of the cervical elements. No subluxation. Skull base and vertebrae: No acute fracture at the skullbase. Vertebral body heights relatively maintained. No acute fracture identified. Soft tissues and spinal canal: Unremarkable cervical soft tissues. Lymph nodes are present, though not enlarged. Disc levels:  Degenerative disc disease worst at C6-C7 with uncovertebral joint disease, endplate changes, anterior osteophyte production. No bony canal narrowing. Upper chest: Unremarkable appearance of the lung apices. Other: No bony canal narrowing. IMPRESSION: Head CT: No CT evidence of acute intracranial abnormality. Cervical CT: No CT evidence of acute fracture or malalignment of the cervical spine. Early degenerative disc disease worst at the C6-C7 level. Electronically Signed   By: Gilmer Mor D.O.   On: 03/08/2017 14:14   Ct Cervical Spine Wo Contrast  Result Date: 03/08/2017 CLINICAL DATA:  41 year old male with a history of seizure and fall EXAM: CT HEAD WITHOUT CONTRAST CT CERVICAL SPINE WITHOUT CONTRAST TECHNIQUE: Multidetector CT imaging of the head and cervical spine was performed following the standard protocol without intravenous contrast. Multiplanar CT image reconstructions of the cervical spine were also generated. COMPARISON:  None. FINDINGS: CT HEAD FINDINGS Brain: No acute intracranial hemorrhage. No midline shift or mass effect. Gray-white differentiation maintained. Unremarkable appearance of the ventricular system. Vascular: No significant vascular calcifications. Skull: No acute fracture.  No aggressive bone lesion identified. Sinuses/Orbits: Unremarkable  appearance of the orbits. Mastoid air cells clear. No middle ear effusion. No significant sinus disease. Other: None CT CERVICAL SPINE FINDINGS Alignment: Craniocervical junction aligned. Anatomic alignment of the cervical elements. No subluxation. Skull base and vertebrae: No acute fracture at the skullbase. Vertebral body heights relatively maintained. No acute fracture identified. Soft tissues and spinal canal: Unremarkable cervical soft tissues. Lymph nodes are present, though not enlarged. Disc levels: Degenerative disc disease worst at C6-C7 with uncovertebral joint disease, endplate changes, anterior osteophyte production. No bony canal  narrowing. Upper chest: Unremarkable appearance of the lung apices. Other: No bony canal narrowing. IMPRESSION: Head CT: No CT evidence of acute intracranial abnormality. Cervical CT: No CT evidence of acute fracture or malalignment of the cervical spine. Early degenerative disc disease worst at the C6-C7 level. Electronically Signed   By: Gilmer Mor D.O.   On: 03/08/2017 14:14   US Abdomen Complete  Result Date: 03/09/2017 CLINICAL DATA:  Cirrhosis and elevated LFTs EXAM: ABDOMEN ULTRASOUND COMPLETE COMPARISON:  None. FINDINGS: Gallbladder: No gallstones or wall thickening visualized. No sonographic Murphy sign noted by sonographer. Common bile duct: Diameter: 4 mm Liver: Mild heterogeneity with increased echogenicity consistent with the given clinical history. IVC: No abnormality visualized. Pancreas: Not well visualized due to overlying bowel gas. Spleen: Size and appearance within normal limits. Right Kidney: Length: 11.9 cm. Echogenicity within normal limits. No mass or hydronephrosis visualized. Left Kidney: Length: 11.7 cm. Echogenicity within normal limits. No mass or hydronephrosis visualized. Abdominal aorta: No aneurysm visualized. Other findings: None. IMPRESSION: Somewhat limited exam although no acute abnormality is noted. Changes in the liver consistent with the given clinical history of cirrhosis. Electronically Signed   By: Alcide Clever M.D.   On: 03/09/2017 07:24    Joanna Puff, MD 03/09/2017, 7:52 AM PGY-3, [facility] FPTS Intern pager: [phone], text pages welcome

## 2017-03-09 NOTE — Progress Notes (Signed)
CRITICAL VALUE ALERT  Critical Value:  Platelets 26  Date & Time Notied:  03/09/17 0340   Provider Notified: FMTS  Orders Received/Actions taken:

## 2017-03-09 NOTE — Progress Notes (Signed)
EEG Completed; Results Pending  

## 2017-03-09 NOTE — Progress Notes (Signed)
Notified family medicine that pt had large emesis after lunch - threw up everything he ate. Orders received

## 2017-03-09 NOTE — Progress Notes (Signed)
Clinical Social Worker met patient at bedside to offer support and shelter needs. Patient stated that on Wednesday (5/30)he plans on taking a train back to [place] and camping out in the area. Patient stated that his plan is to try and make his way into [place]. CSW gave patient resources for shelters in the area in case patient decides to stay in [place]. CSW signing off as patient has no more needs.  Rhea Pink, MSW,  [place]

## 2017-03-10 ENCOUNTER — Inpatient Hospital Stay (HOSPITAL_COMMUNITY): Payer: Self-pay

## 2017-03-10 DIAGNOSIS — E7220 Disorder of urea cycle metabolism, unspecified: Secondary | ICD-10-CM

## 2017-03-10 DIAGNOSIS — G629 Polyneuropathy, unspecified: Secondary | ICD-10-CM

## 2017-03-10 DIAGNOSIS — K7031 Alcoholic cirrhosis of liver with ascites: Secondary | ICD-10-CM

## 2017-03-10 LAB — FOLATE RBC
FOLATE, RBC: 1207 ng/mL (ref >498)
Folate, Hemolysate: 433.3 ng/mL
HEMATOCRIT: 35.9 % — AB (ref 37.5–51.0)

## 2017-03-10 LAB — CBC
HEMATOCRIT: 36.5 % — AB (ref 39.0–52.0)
HEMATOCRIT: 41.7 % (ref 39.0–52.0)
HEMOGLOBIN: 13.6 g/dL (ref 13.0–17.0)
Hemoglobin: 12.2 g/dL — ABNORMAL LOW (ref 13.0–17.0)
MCH: 32 pg (ref 26.0–34.0)
MCH: 31.9 pg (ref 26.0–34.0)
MCHC: 33.4 g/dL (ref 30.0–36.0)
MCHC: 32.6 g/dL (ref 30.0–36.0)
MCV: 95.8 fL (ref 78.0–100.0)
MCV: 97.9 fL (ref 78.0–100.0)
PLATELETS: 26 10*3/uL — AB (ref 150–400)
Platelets: 32 10*3/uL — ABNORMAL LOW (ref 150–400)
RBC: 3.81 MIL/uL — ABNORMAL LOW (ref 4.22–5.81)
RBC: 4.26 MIL/uL (ref 4.22–5.81)
RDW: 16 % — AB (ref 11.5–15.5)
RDW: 15.8 % — ABNORMAL HIGH (ref 11.5–15.5)
WBC: 2.7 10*3/uL — AB (ref 4.0–10.5)
WBC: 3.7 10*3/uL — AB (ref 4.0–10.5)

## 2017-03-10 LAB — COMPREHENSIVE METABOLIC PANEL
ALBUMIN: 4.2 g/dL (ref 3.5–5.0)
ALK PHOS: 279 U/L — AB (ref 38–126)
ALT: 100 U/L — AB (ref 17–63)
AST: 174 U/L — AB (ref 15–41)
Anion gap: 11 (ref 5–15)
BILIRUBIN TOTAL: 3 mg/dL — AB (ref 0.3–1.2)
BUN: 7 mg/dL (ref 6–20)
CO2: 26 mmol/L (ref 22–32)
Calcium: 9.5 mg/dL (ref 8.9–10.3)
Chloride: 102 mmol/L (ref 101–111)
Creatinine, Ser: 0.69 mg/dL (ref 0.61–1.24)
GFR calc Af Amer: >60 mL/min (ref >60)
GFR calc non Af Amer: >60 mL/min (ref >60)
GLUCOSE: 103 mg/dL — AB (ref 65–99)
POTASSIUM: 3.7 mmol/L (ref 3.5–5.1)
Sodium: 139 mmol/L (ref 135–145)
Total Protein: 7.7 g/dL (ref 6.5–8.1)

## 2017-03-10 LAB — GLUCOSE, CAPILLARY
GLUCOSE-CAPILLARY: 117 mg/dL — AB (ref 65–99)
GLUCOSE-CAPILLARY: 91 mg/dL (ref 65–99)
Glucose-Capillary: 94 mg/dL (ref 65–99)

## 2017-03-10 LAB — PHOSPHORUS: Phosphorus: 2.8 mg/dL (ref 2.5–4.6)

## 2017-03-10 LAB — TROPONIN I
Troponin I: <0.03 ng/mL (ref <0.03)
Troponin I: <0.03 ng/mL (ref <0.03)

## 2017-03-10 LAB — RPR: RPR: NONREACTIVE

## 2017-03-10 LAB — PATHOLOGIST SMEAR REVIEW

## 2017-03-10 LAB — MAGNESIUM: Magnesium: 1.6 mg/dL — ABNORMAL LOW (ref 1.7–2.4)

## 2017-03-10 MED ORDER — MAGNESIUM SULFATE 2 GM/50ML IV SOLN
2.0000 g | Freq: Once | INTRAVENOUS | Status: AC
  Administered 2017-03-10: 2 g via INTRAVENOUS
  Filled 2017-03-10: qty 50

## 2017-03-10 MED ORDER — LORAZEPAM 2 MG/ML IJ SOLN
2.0000 mg | Freq: Once | INTRAMUSCULAR | Status: AC
  Administered 2017-03-10: 2 mg via INTRAVENOUS
  Filled 2017-03-10: qty 1

## 2017-03-10 MED ORDER — METOPROLOL TARTRATE 5 MG/5ML IV SOLN
5.0000 mg | Freq: Two times a day (BID) | INTRAVENOUS | Status: DC
Start: 2017-03-10 — End: 2017-03-10
  Filled 2017-03-10: qty 5

## 2017-03-10 MED ORDER — SIMETHICONE 80 MG PO CHEW
80.0000 mg | CHEWABLE_TABLET | Freq: Once | ORAL | Status: AC
  Administered 2017-03-10: 80 mg via ORAL
  Filled 2017-03-10: qty 1

## 2017-03-10 MED ORDER — METOPROLOL TARTRATE 5 MG/5ML IV SOLN
5.0000 mg | Freq: Two times a day (BID) | INTRAVENOUS | Status: DC
  Administered 2017-03-10 – 2017-03-14 (×8): 5 mg via INTRAVENOUS
  Filled 2017-03-10 (×9): qty 5

## 2017-03-10 NOTE — Progress Notes (Signed)
Family Medicine Teaching Service Daily Progress Note Intern Pager: [phone]  Patient name: Alex RidingBilly Michael Vallie Jr. Medical record number: [id number] Date of birth: 08-29-1976 Age: 41 y.o. Gender: male  Primary Care Provider: Patient, No Pcp Per Consultants: Neurology  Code Status: Full   Pt Overview and Major Events to Date:  Admitted to FMTS on 5/27  Assessment and Plan: Alex RidingBilly Michael Delfino Jr. is a 41 y.o. male presenting with seizure like activity . PMH is significant for alcoholic cirrhosis, Apollo disorder (unclear), homelessness  #Seizure-like activity, chronic, resolved Patient with a history of seizures presented with with witnessed seizure like events. Initial  Head CT was negative for any acute findings. EEG done with final read negative for any seizure activity.  Neurology recommends Keppra for seizures. Neurology will follow up on Cervical Spine MRI to rule out bilateral radiculopathies.  --Continue Keppra 500 mg twice a day --Follow up on neurology consult, appreciate recs --Neuro checks as needed  #Alcohol abuse, chronic EtOH elevated on admission to 522 with transaminitis consistent with alcohol. History of complicated withdrawal. 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 Patient has a  History of alcohol abuse and lower extremities neuropathy. Symptoms could be secondary to alcohol abuse and vitamin deficiency though B12 was normal at 519. Patient was also noted to have degenerative changes most prominent at C6-7 on CT. RPR is Non reactive and TSH is also within normal limit. --F/u on HIV --Follow up on MRI c-spine without contrast  #Hyponatremia, resolved   This morning Na is 139 most likely secondary to beer potomania. --Follow up on BMP  #Abdominal  pain/alcoholiccirrhosis US consistent with cirrhosis, with improving transaminitis that was consistent with alcohol abuse. Bilirubin in 3.0 this morning up from 2.0. Hepatitis panel is negative --Continue Lactulose 30g 3 times a day --Continue to Trend LFTs   #Thrombocytopenia, improving Platelets low at 41 on admission, initially decreased, now up trending. Likely splenic sequestration from his cirrhosis. 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 No evidence of active bleed.   --Follow up on pathology smear --Follow up on daily CBC  #Leukopenia, Stable WBC 3.8 on admission, 3.7 this AM and improving from yesterday. Likely secondary to alcohol abuse. Hemoglobin 13.4, with mildly elevated MCV to 93% --Follow up on daily CBC  #Homelessness:  Patient seen by SW and given resources for shelters around town, though patient is planning to move to [place], [place]. --Follow up on HIV and QuantiFERON gold pending   FEN/GI: Regular Diet PPx: SCD  Disposition: Pending imaging for neuropathy and improved withdrawal symptoms  Subjective:  Patient is feeling better this morning also reports some anxiety. Patient still complains of bilateral ulnar distribution numbness and tingling.  Objective: Temp:  [98.4 F (36.9 C)-99.2 F (37.3 C)] 98.7 F (37.1 C) (05/29 0319) Pulse Rate:  [34-132] 112 (05/29 0554) Resp:  [14-21] 20 (05/29 0405) BP: (135-156)/(86-106) 141/88 (05/29 0405) SpO2:  [90 %-98 %] 90 % (05/29 0405)   Physical Exam: General: Sleepy/ reserved, flat affect, NAD,  able to participate in exam Cardiac: RRR, normal heart sounds, no murmurs. 2+ radial and PT pulses bilaterally Respiratory: CTAB, normal effort, No wheezes, rales or rhonchi Abdomen: soft, nontender, nondistended, no hepatic or splenomegaly, +BS Extremities: no edema or cyanosis. WWP. Bilateral fourth and fifth digit numbness and tingling. Skin: warm and dry, no rashes noted Neuro:  alert and oriented x4, no focal  deficits Psych: Normal affect and mood Laboratory:  Recent Labs Lab 03/09/17 0234 03/09/17 1449 03/10/17 0337  WBC 1.8* 2.7* 3.7*  HGB 12.3* 12.2* 13.6  HCT 35.5* 36.5* 41.7  PLT 26* 26* 32*    Recent Labs Lab 03/08/17 1224 03/08/17 1816 03/09/17 0234 03/10/17 0337  NA 134*  --  143 139  K 3.9  --  3.7 3.7  CL 99*  --  110 102  CO2 24  --  24 26  BUN <5*  --  <5* 7  CREATININE 0.59* 0.58* 0.62 0.69  CALCIUM 8.5*  --  8.0* 9.5  PROT 7.0  --  6.5 7.7  BILITOT 1.8*  --  2.0* 3.0*  ALKPHOS 253*  --  218* 279*  ALT 105*  --  97* 100*  AST 198*  --  227* 174*  GLUCOSE 102*  --  90 103*      Imaging/Diagnostic Tests: Dg Chest 2 View  IMPRESSION: Suboptimal inspiration.  No acute cardiopulmonary process. Electronically Signed   By: Carey Bullocks M.D.   On: 03/08/2017 13:32   Ct Head Wo Contrast  Result Date: 03/08/2017 CLINICAL DATA:  41 year old male with a history of seizure and fall EXAM: CT HEAD WITHOUT CONTRAST CT CERVICAL SPINE WITHOUT CONTRAST TECHNIQUE:  IMPRESSION: Head CT: No CT evidence of acute intracranial abnormality. Cervical CT: No CT evidence of acute fracture or malalignment of the cervical spine. Early degenerative disc disease worst at the C6-C7 level. Electronically Signed   By: Gilmer Mor D.O.   On: 03/08/2017 14:14   Ct Cervical Spine Wo Contrast  Result Date: 03/08/2017 CLINICAL DATA:  41 year old male with a history of seizure and fall EXAM: CT HEAD WITHOUT CONTRAST CT CERVICAL SPINE WITHOUT CONTRAST TECHNIQUE: IMPRESSION: Head CT: No CT evidence of acute intracranial abnormality. Cervical CT: No CT evidence of acute fracture or malalignment of the cervical spine. Early degenerative disc disease worst at the C6-C7 level. Electronically Signed   By: Gilmer Mor D.O.   On: 03/08/2017 14:14   US Abdomen Complete  Result Date: 03/09/2017 CLINICAL DATA:  Cirrhosis and elevated LFTs EXAM: ABDOMEN ULTRASOUND COMPLETE COMPARISON: IMPRESSION:  Somewhat limited exam although no acute abnormality is noted. Changes in the liver consistent with the given clinical history of cirrhosis. Electronically Signed   By: Alcide Clever M.D.   On: 03/09/2017 07:24    Lovena Neighbours, MD 03/10/2017, 6:37 AM PGY-1, [facility] FPTS Intern pager: [phone], text pages welcome

## 2017-03-10 NOTE — Progress Notes (Signed)
FPTS Interim Progress Note  S: Called by RN that patient was in SVT x 10 minutes with rate 180-200 and symptomatic- feeling abdominal pain and palpitations.   O: BP (!) 141/88 (BP Location: Right Arm)   Pulse 100   Temp 98.7 F (37.1 C) (Axillary)   Resp 20   Ht 6\' 2"  (1.88 m)   Wt 236 lb 9.6 oz (107.3 kg)   SpO2 90%   BMI 30.38 kg/m   Gen: ill appearing male sitting up in bed holding emesis bag EKG - SVT no discernable P waves  A/P: SVT: reviewed EKG - consistent with SVT without P waves. Patient HR 120s when I arrived. He had a large cough and gag per RN, which abruptly brought his HR from 180s to 120s, which is also consistent with SVT. Discussed with Dr. Eldridge DaceVaranasi (cards fellow on call), who recommended IV beta blocker for symptomatic control at present and adenosine if continued SVT. If adenosine is needed, would need to call code team for ACLS. Will attempt vagal maneuvers again if SVT persistent. No echo on file, but expect this is due to EtOH withdrawal.  -continue tele -IV metoprolol q12 H -troponin x 3  D/w PGY3 Dr. Leonides Schanzorsey at bedside.   Garth Bignessimberlake, Kathryn, MD 03/10/2017, 4:30 AM PGY-1, [facility] pager [phone]

## 2017-03-11 LAB — CBC
HCT: 37 % — ABNORMAL LOW (ref 39.0–52.0)
Hemoglobin: 12.3 g/dL — ABNORMAL LOW (ref 13.0–17.0)
MCH: 32.4 pg (ref 26.0–34.0)
MCHC: 33.2 g/dL (ref 30.0–36.0)
MCV: 97.4 fL (ref 78.0–100.0)
PLATELETS: 30 10*3/uL — AB (ref 150–400)
RBC: 3.8 MIL/uL — AB (ref 4.22–5.81)
RDW: 15.7 % — AB (ref 11.5–15.5)
WBC: 4 10*3/uL (ref 4.0–10.5)

## 2017-03-11 LAB — GLUCOSE, CAPILLARY: Glucose-Capillary: 124 mg/dL — ABNORMAL HIGH (ref 65–99)

## 2017-03-11 LAB — COMPREHENSIVE METABOLIC PANEL
ALT: 91 U/L — ABNORMAL HIGH (ref 17–63)
ANION GAP: 8 (ref 5–15)
AST: 127 U/L — ABNORMAL HIGH (ref 15–41)
Albumin: 3.8 g/dL (ref 3.5–5.0)
Alkaline Phosphatase: 274 U/L — ABNORMAL HIGH (ref 38–126)
BUN: 7 mg/dL (ref 6–20)
CHLORIDE: 101 mmol/L (ref 101–111)
CO2: 28 mmol/L (ref 22–32)
Calcium: 9.1 mg/dL (ref 8.9–10.3)
Creatinine, Ser: 0.68 mg/dL (ref 0.61–1.24)
Glucose, Bld: 97 mg/dL (ref 65–99)
POTASSIUM: 3.8 mmol/L (ref 3.5–5.1)
Sodium: 137 mmol/L (ref 135–145)
TOTAL PROTEIN: 7.2 g/dL (ref 6.5–8.1)
Total Bilirubin: 3.1 mg/dL — ABNORMAL HIGH (ref 0.3–1.2)

## 2017-03-11 LAB — MAGNESIUM: MAGNESIUM: 1.9 mg/dL (ref 1.7–2.4)

## 2017-03-11 LAB — QUANTIFERON IN TUBE
QFT TB AG MINUS NIL VALUE: 1.06 IU/mL
QUANTIFERON MITOGEN VALUE: 6.96 [IU]/mL
QUANTIFERON TB AG VALUE: 1.14 IU/mL
QUANTIFERON TB GOLD: POSITIVE — AB
Quantiferon Nil Value: 0.08 IU/mL

## 2017-03-11 LAB — QUANTIFERON TB GOLD ASSAY (BLOOD)

## 2017-03-11 MED ORDER — DIAZEPAM 5 MG PO TABS: 5.0000 mg | ORAL_TABLET | Freq: Once | ORAL | Status: DC | PRN

## 2017-03-11 NOTE — Progress Notes (Signed)
Family Medicine Teaching Service Daily Progress Note Intern Pager: [phone]  Patient name: Alex Klein. Medical record number: [id number] Date of birth: 31-Mar-1976 Age: 41 y.o. Gender: male  Primary Care Provider: Patient, No Pcp Per Consultants: Neurology  Code Status: Full   Pt Overview and Major Events to Date:  Admitted to FMTS on 5/27  Assessment and Plan: Alex Klein. is a 41 y.o. male presenting with seizure like activity . PMH is significant for alcoholic cirrhosis, Apollo disorder (unclear), homelessness  #Seizure-like activity, chronic, resolved Patient with a history of seizures presented with witnessed seizure like events. Initial  Head CT was negative for any acute findings. EEG done with final read negative for any seizure activity.  Neurology recommends Keppra for seizures. Neurology will follow up on Cervical Spine MRI to rule out bilateral radiculopathies.  --Continue Keppra 500 mg twice a day --Follow up on neurology consult, appreciate recs --Neuro checks as needed  #Alcohol abuse, chronic Patient was agitated overnight, likely secondary to EtOH withdrawal. Patient was finally able to settle down this morning. CIWA scores overnight have been 12>8>11>13. High risk for severe alcohol withdrawal given history of alcohol abuse. 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 Patient has a  History of alcohol abuse and lower extremities neuropathy. Symptoms could be secondary to alcohol abuse and vitamin deficiency though B12 was normal at 519. Patient was also noted to have degenerative changes most prominent at C6-7 on CT.  Cervical MRI showed no fracture or malalignment. 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 Mild canal stenosis C5-6. Neural foraminal  narrowing C3-4 thru C6-7: Moderate on the RIGHT at C3-4 and C6-7. Finding could explain bilateral numbness which appears to be less acute and more chronic given congenital nature of part of the findings. --Will follow up with Neurology appreciate recs  #SVT, resolved Patient with an episode of SVT into the 180 on 5/29, self resolved after an episode of valsalva. Patient continued to have mild tachycardia requiring IV metoprolol. Appears to have resolved.  --Will continue Metoprolol 5 mg bid during withdrawal period.  #Hyponatremia, resolved   This morning Na is 139 most likely secondary to beer potomania. --Follow up on BMP  #Abdominal pain/alcoholiccirrhosis [place] consistent with cirrhosis, with improving transaminitis that was consistent with alcohol abuse. Bilirubin in 3.0 this morning up from 2.0. Hepatitis panel is negative --Continue Lactulose 30g 3 times a day --Continue to Trend LFTs   #Thrombocytopenia, improving Platelets continue to be low but stable. Likely splenic sequestration from his cirrhosis. 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 No evidence of active bleed.  --Follow up on pathology smear --Follow up on daily CBC  #Leukopenia, Stable WBC 3.8 on admission, 4.0 this AM continue to improving . Likely secondary to alcohol abuse. Hemoglobin 12.3, with mildly elevated MCV to 97% --Follow up on daily CBC  #Homelessness:  Patient seen by SW and given resources for shelters around town, though patient is planning to move to [place], [place]. --Follow up on HIV and QuantiFERON gold pending   FEN/GI: Regular Diet PPx: SCD  Disposition: Patient is still acutely withdrawing, continue inpatient monitoring  Subjective:  Patient was calmer this morning after an eventful night and appears alert and oriented. Patient reports he just broke up with his girlfriend this morning. He still endorse some anxiety, but has been reasonable with nursing staff and  his  demands.  Objective: Temp:  [98.7 F (37.1 C)-99.8 F (37.7 C)] 99.4 F (37.4 C) (05/30 0402) Pulse Rate:  [32-102] 100 (05/30 0402) Resp:  [18-22] 18 (05/30 0402) BP: (122-142)/(82-106) 122/82 (05/30 0402) SpO2:  [91 %-99 %] 91 % (05/30 0402)   Physical Exam: General: Sleepy/ reserved, flat affect, NAD,  able to participate in exam Cardiac: RRR, normal heart sounds, no murmurs. 2+ radial and PT pulses bilaterally Respiratory: CTAB, normal effort, No wheezes, rales or rhonchi Abdomen: soft, nontender, nondistended, no hepatic or splenomegaly, +BS Extremities: no edema or cyanosis. WWP. Bilateral fourth and fifth digit numbness and tingling. Skin: warm and dry, no rashes noted Neuro: alert and oriented x4, no focal deficits Psych: Normal affect and mood Laboratory:  Recent Labs Lab 03/09/17 1449 03/10/17 0337 03/11/17 0235  WBC 2.7* 3.7* 4.0  HGB 12.2* 13.6 12.3*  HCT 36.5* 41.7 37.0*  PLT 26* 32* 30*    Recent Labs Lab 03/08/17 1224 03/08/17 1816 03/09/17 0234 03/10/17 0337  NA 134*  --  143 139  K 3.9  --  3.7 3.7  CL 99*  --  110 102  CO2 24  --  24 26  BUN <5*  --  <5* 7  CREATININE 0.59* 0.58* 0.62 0.69  CALCIUM 8.5*  --  8.0* 9.5  PROT 7.0  --  6.5 7.7  BILITOT 1.8*  --  2.0* 3.0*  ALKPHOS 253*  --  218* 279*  ALT 105*  --  97* 100*  AST 198*  --  227* 174*  GLUCOSE 102*  --  90 103*     Imaging/Diagnostic Tests: Mr Cervical Spine Wo Contrast  Result Date: 03/10/2017 CLINICAL DATA:  Witnessed seizure-like activity. History of alcohol abuse and extremity nephropathy. EXAM: MRI CERVICAL SPINE WITHOUT CONTRAST TECHNIQUE:  IMPRESSION: No fracture or malalignment. 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 Mild canal stenosis C5-6. Neural foraminal narrowing C3-4 thru C6-7: Moderate on the RIGHT at C3-4 and C6-7. Electronically Signed   By: Awilda Metro M.D.   On: 03/10/2017 22:11    Dg Chest 2 View  IMPRESSION:  Suboptimal inspiration.  No acute cardiopulmonary process. Electronically Signed   By: Carey Bullocks M.D.   On: 03/08/2017 13:32   Ct Head Wo Contrast  Result Date: 03/08/2017 CLINICAL DATA:  41 year old male with a history of seizure and fall EXAM: CT HEAD WITHOUT CONTRAST CT CERVICAL SPINE WITHOUT CONTRAST TECHNIQUE:  IMPRESSION: Head CT: No CT evidence of acute intracranial abnormality. Cervical CT: No CT evidence of acute fracture or malalignment of the cervical spine. Early degenerative disc disease worst at the C6-C7 level. Electronically Signed   By: Gilmer Mor D.O.   On: 03/08/2017 14:14   Ct Cervical Spine Wo Contrast  Result Date: 03/08/2017 CLINICAL DATA:  42 year old male with a history of seizure and fall EXAM: CT HEAD WITHOUT CONTRAST CT CERVICAL SPINE WITHOUT CONTRAST TECHNIQUE: IMPRESSION: Head CT: No CT evidence of acute intracranial abnormality. Cervical CT: No CT evidence of acute fracture or malalignment of the cervical spine. Early degenerative disc disease worst at the C6-C7 level. Electronically Signed   By: Gilmer Mor D.O.   On: 03/08/2017 14:14   US Abdomen Complete  Result Date: 03/09/2017 CLINICAL DATA:  Cirrhosis and elevated LFTs EXAM: ABDOMEN ULTRASOUND COMPLETE COMPARISON: IMPRESSION: Somewhat limited exam although no acute abnormality is noted. Changes in the liver consistent with the given clinical history of cirrhosis. Electronically Signed   By: Alcide Clever  M.D.   On: 03/09/2017 07:24    Lovena Neighboursiallo, Elric Tirado, MD 03/11/2017, 6:32 AM PGY-1, [facility] FPTS Intern pager: [phone], text pages welcome

## 2017-03-11 NOTE — Progress Notes (Signed)
Upon entering room patient was standing at the side of the bed with blood coming from the IV tubing that he had pulled apart accidentally in the bathroom with blood on the wall, floor and urine on the floor.  Patient alert, orient x3 forgot to ask for help.  After 2nd bathe patient ate 75% of his lunch then rested.

## 2017-03-11 NOTE — Progress Notes (Signed)
Pt very agitated at this time. Setting off bed alarm repeatedly, anxious, guarded, several attempts to remove his gown and "get dressed". He has had 2 x 2mg  Ativan IV since 0422 for CIWA 11 and follow up CIWA 13. No remarkable effect. MD Chanetta Marshallimberlake notified. Advised that she will come see pt. Will continue to monitor.

## 2017-03-11 NOTE — Progress Notes (Signed)
Mr. Alex RidingBilly Michael Chaviano Jr. is a 41 year old male with a long history of alcohol abuse who [facility] following witnessed seizure activity. Based on his history of alcohol abuse, he was placed on CIWA protocol and dosed with Keppra 500mg  q12h without further seizure activity. He stated that he would have seizures approximately once per year and he would be open to continuing an antiepileptic medication for seizure prevention.  On arrival, Mr. Alex Klein reported an acute onset of fourth and fifth digit numbness of the hands bilaterally that began 2-3 weeks ago. C-spine imaging showed nol etiology of this. Suspect ulnar entrapment and suggest outpatient NCV for further characterization and treatment.  Recommendations: 1) Outpatient neurological follow up for seizure control and ulnar neuropathy when stable 2) Continue Keppra  At this time, the Neurohospitalist team will sign off.   Bruna PotterJamie Aldridge PA-C [facility]  03/11/2017, 9:22 AM

## 2017-03-12 DIAGNOSIS — F10230 Alcohol dependence with withdrawal, uncomplicated: Secondary | ICD-10-CM

## 2017-03-12 DIAGNOSIS — D61818 Other pancytopenia: Secondary | ICD-10-CM

## 2017-03-12 DIAGNOSIS — R740 Nonspecific elevation of levels of transaminase and lactic acid dehydrogenase [LDH]: Secondary | ICD-10-CM

## 2017-03-12 DIAGNOSIS — Z227 Latent tuberculosis: Secondary | ICD-10-CM

## 2017-03-12 DIAGNOSIS — F10930 Alcohol use, unspecified with withdrawal, uncomplicated: Secondary | ICD-10-CM

## 2017-03-12 DIAGNOSIS — R7611 Nonspecific reaction to tuberculin skin test without active tuberculosis: Secondary | ICD-10-CM

## 2017-03-12 DIAGNOSIS — Z9189 Other specified personal risk factors, not elsewhere classified: Secondary | ICD-10-CM

## 2017-03-12 DIAGNOSIS — F10188 Alcohol abuse with other alcohol-induced disorder: Secondary | ICD-10-CM

## 2017-03-12 DIAGNOSIS — G40909 Epilepsy, unspecified, not intractable, without status epilepticus: Secondary | ICD-10-CM

## 2017-03-12 DIAGNOSIS — Z590 Homelessness: Secondary | ICD-10-CM

## 2017-03-12 LAB — CBC
HCT: 35.8 % — ABNORMAL LOW (ref 39.0–52.0)
HEMOGLOBIN: 11.9 g/dL — AB (ref 13.0–17.0)
MCH: 32.4 pg (ref 26.0–34.0)
MCHC: 33.2 g/dL (ref 30.0–36.0)
MCV: 97.5 fL (ref 78.0–100.0)
Platelets: 44 10*3/uL — ABNORMAL LOW (ref 150–400)
RBC: 3.67 MIL/uL — AB (ref 4.22–5.81)
RDW: 15.7 % — ABNORMAL HIGH (ref 11.5–15.5)
WBC: 3 10*3/uL — AB (ref 4.0–10.5)

## 2017-03-12 LAB — COMPREHENSIVE METABOLIC PANEL
ALT: 85 U/L — AB (ref 17–63)
AST: 109 U/L — ABNORMAL HIGH (ref 15–41)
Albumin: 3.5 g/dL (ref 3.5–5.0)
Alkaline Phosphatase: 236 U/L — ABNORMAL HIGH (ref 38–126)
Anion gap: 9 (ref 5–15)
BUN: 8 mg/dL (ref 6–20)
CALCIUM: 8.7 mg/dL — AB (ref 8.9–10.3)
CO2: 24 mmol/L (ref 22–32)
CREATININE: 0.67 mg/dL (ref 0.61–1.24)
Chloride: 106 mmol/L (ref 101–111)
GFR calc Af Amer: >60 mL/min (ref >60)
GFR calc non Af Amer: >60 mL/min (ref >60)
Glucose, Bld: 100 mg/dL — ABNORMAL HIGH (ref 65–99)
Potassium: 3.7 mmol/L (ref 3.5–5.1)
SODIUM: 139 mmol/L (ref 135–145)
Total Bilirubin: 2.6 mg/dL — ABNORMAL HIGH (ref 0.3–1.2)
Total Protein: 6.6 g/dL (ref 6.5–8.1)

## 2017-03-12 NOTE — Progress Notes (Addendum)
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 Randolm IdolFletke of my conversation with IP and he is aware and agrees with discontinuing airborne  precautions.

## 2017-03-12 NOTE — Care Management Note (Addendum)
Case Management Note  Patient Details  Name: Alex RidingBilly Michael Calip Jr. MRN: [id number] Date of Birth: 1976-04-22  Subjective/Objective:   Patient is Homeless, presents with seizure, he is interested in getting resources for [employer] and bus pass for transportation.  NCM [facility]  He also will need ast with medications, if discharged during the week he will need to go to [facility], if discharged over the weekend ,will need Match Letter.    PCP- none-will try to get hospital [facility] - has apt at 6/22 at 10:30 am.                  Action/Plan: NCM will continue to follow for dc needs.  Expected Discharge Date:  03/11/17               Expected Discharge Plan:  Homeless Shelter  In-House Referral:  Clinical Social Work  Discharge planning Services  CM Consult  Post Acute Care Choice:    Choice offered to:     DME Arranged:    DME Agency:     HH Arranged:    HH Agency:     Status of Service:  In process, will continue to follow  If discussed at Long Length of Stay Meetings, dates discussed:    Additional Comments:  Alex Klein, Alex Waren Clinton, RN 03/12/2017, 11:53 AM

## 2017-03-12 NOTE — Consult Note (Signed)
[facility] for Infectious Disease  Total days of antibiotics 0         Admission Date: 03/08/2017      Reason for Consult: Positive Quantiferon     Referring Physician: Ree Kida  Active Problems:   Seizure-like activity ([place])   Alcoholic cirrhosis ([place])   Encephalopathy   Thrombocytopenia ([place])   Hyperammonemia ([place])   Neuropathy    HPI: Alex Klein. is a 41 y.o. male that presented to the ED with seizure like activity. PMHx significant for seizure disorder, alcohol abuse, transaminitis, homelessness.  He was brought to the ED via [facility] after a friend witnessed Alex Klein fall to the ground shaking with brief LOC.  His gait was very unsteady during ED intake and he reported he had 18 cans of beer that day. ETOH level was 525 on admission. CT of the head was negative and he was not found to have any focal neurologic deficits. He was also found to have elevated ammonia level (83) and transaminitis. He does endorse a 3 week history of abdominal pain. He was loaded with Keppra and given lactulose in ED. EEG on 5/28 was normal; no seizure activity noted on study. Quantiferon was checked upon admission and is positive. Transported to Airborne room and we were asked to see the patient.   Positive Quantiferon = Mr. Alex Klein tells me that he is originally from [place] and has been in [place] for about 3 weeks now. Prior to that he spent time in [place] and [place]. He came to [place] to be with friends. He does have a past travel history including [place], [place] and [place] (about 20 years ago). He is currently homeless. He spent time in prison about 5 or 6 years ago for a total of 2 months. He denies contact with anyone that has had Tuberculosis from what he recalls. He denies fevers, weight loss, cough or sputum production. He does endorse night sweats and decreased appetite but attributes that to his alcoholism. He is a heavy daily drinker and has been for > 30 years averaging 18-20  cans of beer daily. He does not use any other drugs. He is interested in alcohol cessation.   Abdominal Pain = reports ongoing constant sharp abdominal pain / bloating x 3 weeks. He has required paracentesis in the past. Diagnosed with cirrhosis about 8 years ago.   Past Medical History:  Diagnosis Date  . Bipolar 1 disorder ([place])   . Seizures ([facility])     Allergies: No Known Allergies   MEDICATIONS: . folic acid  1 mg Intravenous Daily   Or  . folic acid  1 mg Oral Daily  . gabapentin  300 mg Oral QHS  . lactulose  30 g Oral TID  . metoprolol tartrate  5 mg Intravenous Q12H  . multivitamin with minerals  1 tablet Oral Daily  . thiamine  100 mg Oral Daily   Or  . thiamine injection  100 mg Intravenous Daily    Social History  Substance Use Topics  . Smoking status: Never Smoker  . Smokeless tobacco: Never Used  . Alcohol use 10.8 oz/week    18 Cans of beer per week    History reviewed. No pertinent family history.  Review of Systems - Negative except for what is discussed in HPI   OBJECTIVE: Temp:  [98.1 F (36.7 C)-100 F (37.8 C)] 99.5 F (37.5 C) (05/31 0932) Pulse Rate:  [31-107] 95 (05/31 0800) Resp:  [14-18] 14 (  05/31 0800) BP: (106-125)/(64-93) 125/87 (05/31 0800) SpO2:  [95 %-100 %] 95 % (05/31 0800)   General appearance: Calm and cooperative during exam and interview. Well nourished appearing male lying in bed with noticible shaking and perspiring. Not in acute distress.  Throat: lips, mucosa, and tongue normal; teeth and gums normal Neck: no adenopathy, no JVD, supple, symmetrical, trachea midline and thyroid not enlarged, symmetric, no tenderness/mass/nodules Resp: clear to auscultation bilaterally and normal rate and effort. No cough observed.  Cardio: normal apical impulse, regular rate and rhythm and S1, S2 normal GI: Distended and firm. Painful with palpation especially over RUQ and right flank. Does have some fluid waves with percussion. Palpable  liver boarder.  Extremities: extremities normal, atraumatic, no cyanosis or edema Pulses: 2+ and symmetric Lymph nodes: Cervical, supraclavicular, and axillary nodes normal. Neurologic: Alert and oriented X 3, normal strength and tone. Normal symmetric reflexes. Normal coordination and gait  LABS: Results for orders placed or performed during the hospital encounter of 03/08/17 (from the past 48 hour(s))  Troponin I (q 6hr x 3)     Status: None   Collection Time: 03/10/17  3:12 PM  Result Value Ref Range   Troponin I <0.03 <0.03 ng/mL  Glucose, capillary     Status: None   Collection Time: 03/10/17  4:52 PM  Result Value Ref Range   Glucose-Capillary 91 65 - 99 mg/dL  CBC     Status: Abnormal   Collection Time: 03/11/17  2:35 AM  Result Value Ref Range   WBC 4.0 4.0 - 10.5 K/uL   RBC 3.80 (L) 4.22 - 5.81 MIL/uL   Hemoglobin 12.3 (L) 13.0 - 17.0 g/dL   HCT 37.0 (L) 39.0 - 52.0 %   MCV 97.4 78.0 - 100.0 fL   MCH 32.4 26.0 - 34.0 pg   MCHC 33.2 30.0 - 36.0 g/dL   RDW 15.7 (H) 11.5 - 15.5 %   Platelets 30 (L) 150 - 400 K/uL    Comment: CONSISTENT WITH PREVIOUS RESULT  Magnesium     Status: None   Collection Time: 03/11/17  2:35 AM  Result Value Ref Range   Magnesium 1.9 1.7 - 2.4 mg/dL  Comprehensive metabolic panel     Status: Abnormal   Collection Time: 03/11/17  6:48 AM  Result Value Ref Range   Sodium 137 135 - 145 mmol/L   Potassium 3.8 3.5 - 5.1 mmol/L   Chloride 101 101 - 111 mmol/L   CO2 28 22 - 32 mmol/L   Glucose, Bld 97 65 - 99 mg/dL   BUN 7 6 - 20 mg/dL   Creatinine, Ser 0.68 0.61 - 1.24 mg/dL   Calcium 9.1 8.9 - 10.3 mg/dL   Total Protein 7.2 6.5 - 8.1 g/dL   Albumin 3.8 3.5 - 5.0 g/dL   AST 127 (H) 15 - 41 U/L   ALT 91 (H) 17 - 63 U/L   Alkaline Phosphatase 274 (H) 38 - 126 U/L   Total Bilirubin 3.1 (H) 0.3 - 1.2 mg/dL   GFR calc non Af Amer >60 >60 mL/min   GFR calc Af Amer >60 >60 mL/min    Comment: (NOTE) The eGFR has been calculated using the CKD EPI  equation. This calculation has not been validated in all clinical situations. eGFR's persistently <60 mL/min signify possible Chronic Kidney Disease.    Anion gap 8 5 - 15  Glucose, capillary     Status: Abnormal   Collection Time: 03/11/17  7:43 PM  Result Value Ref Range   Glucose-Capillary 124 (H) 65 - 99 mg/dL   Comment 1 Notify RN    Comment 2 Document in Chart   CBC     Status: Abnormal   Collection Time: 03/12/17  5:41 AM  Result Value Ref Range   WBC 3.0 (L) 4.0 - 10.5 K/uL   RBC 3.67 (L) 4.22 - 5.81 MIL/uL   Hemoglobin 11.9 (L) 13.0 - 17.0 g/dL   HCT 35.8 (L) 39.0 - 52.0 %   MCV 97.5 78.0 - 100.0 fL   MCH 32.4 26.0 - 34.0 pg   MCHC 33.2 30.0 - 36.0 g/dL   RDW 15.7 (H) 11.5 - 15.5 %   Platelets 44 (L) 150 - 400 K/uL    Comment: CONSISTENT WITH PREVIOUS RESULT  Comprehensive metabolic panel     Status: Abnormal   Collection Time: 03/12/17  5:41 AM  Result Value Ref Range   Sodium 139 135 - 145 mmol/L   Potassium 3.7 3.5 - 5.1 mmol/L   Chloride 106 101 - 111 mmol/L   CO2 24 22 - 32 mmol/L   Glucose, Bld 100 (H) 65 - 99 mg/dL   BUN 8 6 - 20 mg/dL   Creatinine, Ser 0.67 0.61 - 1.24 mg/dL   Calcium 8.7 (L) 8.9 - 10.3 mg/dL   Total Protein 6.6 6.5 - 8.1 g/dL   Albumin 3.5 3.5 - 5.0 g/dL   AST 109 (H) 15 - 41 U/L   ALT 85 (H) 17 - 63 U/L   Alkaline Phosphatase 236 (H) 38 - 126 U/L   Total Bilirubin 2.6 (H) 0.3 - 1.2 mg/dL   GFR calc non Af Amer >60 >60 mL/min   GFR calc Af Amer >60 >60 mL/min    Comment: (NOTE) The eGFR has been calculated using the CKD EPI equation. This calculation has not been validated in all clinical situations. eGFR's persistently <60 mL/min signify possible Chronic Kidney Disease.    Anion gap 9 5 - 15    IMAGING: Mr Cervical Spine Wo Contrast  Result Date: 03/10/2017 CLINICAL DATA:  Witnessed seizure-like activity. History of alcohol abuse and extremity nephropathy. EXAM: MRI CERVICAL SPINE WITHOUT CONTRAST TECHNIQUE: Multiplanar,  multisequence MR imaging of the cervical spine was performed. No intravenous contrast was administered. COMPARISON:  CT cervical spine Mar 08, 2017 FINDINGS: ALIGNMENT: Straightened cervical lordosis.  No malalignment. VERTEBRAE/DISCS: Vertebral bodies are intact. Mild C6-7 disc height loss with moderate subacute chronic discogenic endplate changes. Remaining disc morphology is preserved. Minimal chronic discogenic endplate changes F3-5. No abnormal or acute bone marrow signal. Congenital canal narrowing on the basis of foreshortened pedicles. CORD:Cervical spinal cord is normal morphology and signal characteristics from the cervicomedullary junction to level of T2-3, the most caudal well visualized level. POSTERIOR FOSSA, VERTEBRAL ARTERIES, PARASPINAL TISSUES: No MR findings of ligamentous injury. Vertebral artery flow voids present. Included posterior fossa and paraspinal soft tissues are normal. DISC LEVELS (axial sequences do not cross reference to sagittal sequences, assessment limited as laterality cannot be determined): C2-3: Small LEFT central disc protrusion. No canal stenosis or neural foraminal narrowing. C3-4: Small LEFT central disc protrusion. Uncovertebral hypertrophy. No canal stenosis. Moderate RIGHT neural foraminal narrowing. C4-5: Uncovertebral hypertrophy. Mild RIGHT grand LEFT facet arthropathy. No canal stenosis. Mild bilateral neural foraminal narrowing. C5-6: Small RIGHT central disc protrusion, uncovertebral hypertrophy and mild RIGHT facet arthropathy. Mild canal stenosis. Mild RIGHT, mild to moderate LEFT neural foraminal narrowing. C6-7: Annular bulging, uncovertebral hypertrophy. No canal stenosis. Moderate RIGHT,  mild to moderate LEFT neural foraminal narrowing. C7-T1: Tiny RIGHT central disc protrusion without canal stenosis or neural foraminal narrowing. IMPRESSION: No fracture or malalignment. 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 Mild canal  stenosis C5-6. Neural foraminal narrowing C3-4 thru C6-7: Moderate on the RIGHT at C3-4 and C6-7. Electronically Signed   By: Elon Alas M.D.   On: 03/10/2017 22:11    Assessment/Plan:  41 yo male with history of seizures, ETOH abuse, cirrhosis, homelessness now with +Quantiferon. No active pulmonary symptoms and cxr clear.   + Quantiferon =  - Does have known risk factors (homeless, incarceration and travel to [place]) for mTB - 2V-CXR reviewed from 5/27 and without suspicious lesions or cavitation - Agree with D/C of airborne precautions  Seizures =  - Prolactin slightly elevated 19.7 - On CIWA protocol for alcohol withdrawal and is receiving thiamin/folate.   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 Would defer treatment at this time with degree of liver involvement. Recommend outpatient follow up with the [facility] (information provided on DC Navigator) to determine appropriate time for treatment.   Thank you. Available for questions.   Janene Madeira, MSN, NP-C [facility] for Infectious [place] Cell: [phone] Pager: [phone]  03/12/2017, 12:21 PM

## 2017-03-12 NOTE — Progress Notes (Signed)
Family Medicine Teaching Service Daily Progress Note Intern Pager: [phone]  Patient name: Alex RidingBilly Michael Bhagat Jr. Medical record number: [id number] Date of birth: 11-30-1975 Age: 41 y.o. Gender: male  Primary Care Provider: Patient, No Pcp Per Consultants: Neurology  Code Status: Full   Pt Overview and Major Events to Date:  Admitted to FMTS on 5/27  Assessment and Plan: Alex RidingBilly Michael Cashaw Jr. is a 41 y.o. male presenting with seizure like activity . PMH is significant for alcoholic cirrhosis, Apollo disorder (unclear), homelessness  #Seizure-like activity, chronic, resolved Patient with a history of seizures presented with witnessed seizure like events. Initial  Head CT was negative for any acute findings. EEG done with final read negative for any seizure activity.  Neurology recommends Keppra for seizures.  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 Patient was placed in a negative pressure room. Patient has no recollection of ever being exposed or diagnosed with TB.  --Will consult Infectious Diseases, appreciate recs   #Alcohol abuse, chronic Patient was agitated overnight, likely secondary to EtOH withdrawal. Patient was finally able to settle down this morning. CIWA scores overnight have been 18>8>8>8. High risk for severe alcohol withdrawal given history of alcohol abuse. 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 Mild canal stenosis C5-6. Neural foraminal narrowing C3-4 thru C6-7: Moderate on the RIGHT at C3-4 and C6-7. --Will follow up with Neurology appreciate recs  #SVT,  resolved Patient with an episode of SVT into the 180 on 5/29, self resolved after an episode of valsalva. Patient continued to have mild tachycardia requiring IV metoprolol. Appears to have resolved.  --Will continue Metoprolol 5 mg bid during withdrawal period.  #Hyponatremia, resolved   This morning Na is 139 most likely secondary to beer potomania. --Follow up on BMP  #Abdominal pain/alcoholiccirrhosis US consistent with cirrhosis, with improving transaminitis that was consistent with alcohol abuse. Bilirubin in 3.0 this morning up from 2.0. Hepatitis panel is negative --Continue Lactulose 30g 3 times a day --Continue to Trend LFTs   #Thrombocytopenia, improving Platelets continue to be low but stable. Likely splenic sequestration from his cirrhosis. 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 No evidence of active bleed.  --Follow up on pathology smear --Follow up on daily CBC  #Leukopenia, Stable WBC 3.8 on admission, 4.0 this AM continue to improving . Likely secondary to alcohol abuse. Hemoglobin 12.3, with mildly elevated MCV to 97% --Follow up on daily CBC  #Homelessness:  Patient seen by SW and given resources for shelters around town, though patient is planning to move to [place], [place]. --Follow up on HIV and QuantiFERON gold pending   FEN/GI: Regular Diet PPx: SCD  Disposition: Still under withdrawal protocol, will continue to monitor  Subjective:  Patient is feeling fine this morning, tremors have improved slightly. Patient was surprised by his Quant gold test results and seem concern. He had a few questions about TB that I answered and try to reassure the patient.  Objective: Temp:  [98.1 F (36.7 C)-100 F (37.8 C)] 99.5 F (37.5 C) (05/31 0625) Pulse Rate:  [31-107] 86 (05/31 0625) Resp:  [15-18] 16 (05/31 0625) BP: (106-150)/(64-95) 111/64 (05/31 0625) SpO2:  [95 %-100 %] 97 % (05/31 14780625)   Physical Exam:  General: Sleepy/ reserved, flat affect,  NAD,  able to participate in exam Cardiac: RRR, normal heart sounds, no murmurs. 2+ radial and PT pulses bilaterally Respiratory: CTAB, normal effort, No wheezes, rales or rhonchi Abdomen: soft, nontender, nondistended, no hepatic or splenomegaly, +BS Extremities: no edema or cyanosis. WWP. Bilateral fourth and fifth digit numbness and tingling. Skin: warm and dry, no rashes noted Neuro: alert and oriented x4, no focal deficits Psych: Normal affect and mood Laboratory:  Recent Labs Lab 03/10/17 0337 03/11/17 0235 03/12/17 0541  WBC 3.7* 4.0 3.0*  HGB 13.6 12.3* 11.9*  HCT 41.7 37.0* 35.8*  PLT 32* 30* 44*    Recent Labs Lab 03/10/17 0337 03/11/17 0648 03/12/17 0541  NA 139 137 139  K 3.7 3.8 3.7  CL 102 101 106  CO2 26 28 24   BUN 7 7 8   CREATININE 0.69 0.68 0.67  CALCIUM 9.5 9.1 8.7*  PROT 7.7 7.2 6.6  BILITOT 3.0* 3.1* 2.6*  ALKPHOS 279* 274* 236*  ALT 100* 91* 85*  AST 174* 127* 109*  GLUCOSE 103* 97 100*    Imaging/Diagnostic Tests: Mr Cervical Spine Wo Contrast  Result Date: 03/10/2017 CLINICAL DATA:  Witnessed seizure-like activity. History of alcohol abuse and extremity nephropathy. EXAM: MRI CERVICAL SPINE WITHOUT CONTRAST TECHNIQUE:  IMPRESSION: No fracture or malalignment. 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 Mild canal stenosis C5-6. Neural foraminal narrowing C3-4 thru C6-7: Moderate on the RIGHT at C3-4 and C6-7. Electronically Signed   By: Awilda Metro M.D.   On: 03/10/2017 22:11    Dg Chest 2 View  IMPRESSION: Suboptimal inspiration.  No acute cardiopulmonary process. Electronically Signed   By: Carey Bullocks M.D.   On: 03/08/2017 13:32   Ct Head Wo Contrast  Result Date: 03/08/2017 CLINICAL DATA:  41 year old male with a history of seizure and fall EXAM: CT HEAD WITHOUT CONTRAST CT CERVICAL SPINE WITHOUT CONTRAST TECHNIQUE:  IMPRESSION: Head CT: No CT evidence of acute intracranial abnormality. Cervical CT:  No CT evidence of acute fracture or malalignment of the cervical spine. Early degenerative disc disease worst at the C6-C7 level. Electronically Signed   By: Gilmer Mor D.O.   On: 03/08/2017 14:14   Ct Cervical Spine Wo Contrast  Result Date: 03/08/2017 CLINICAL DATA:  41 year old male with a history of seizure and fall EXAM: CT HEAD WITHOUT CONTRAST CT CERVICAL SPINE WITHOUT CONTRAST TECHNIQUE: IMPRESSION: Head CT: No CT evidence of acute intracranial abnormality. Cervical CT: No CT evidence of acute fracture or malalignment of the cervical spine. Early degenerative disc disease worst at the C6-C7 level. Electronically Signed   By: Gilmer Mor D.O.   On: 03/08/2017 14:14   US Abdomen Complete  Result Date: 03/09/2017 CLINICAL DATA:  Cirrhosis and elevated LFTs EXAM: ABDOMEN ULTRASOUND COMPLETE COMPARISON: IMPRESSION: Somewhat limited exam although no acute abnormality is noted. Changes in the liver consistent with the given clinical history of cirrhosis. Electronically Signed   By: Alcide Clever M.D.   On: 03/09/2017 07:24    Lovena Neighbours, MD 03/12/2017, 7:09 AM PGY-1, [facility] FPTS Intern pager: [phone], text pages welcome

## 2017-03-13 DIAGNOSIS — F10230 Alcohol dependence with withdrawal, uncomplicated: Secondary | ICD-10-CM

## 2017-03-13 DIAGNOSIS — K7460 Unspecified cirrhosis of liver: Secondary | ICD-10-CM

## 2017-03-13 LAB — BASIC METABOLIC PANEL
ANION GAP: 6 (ref 5–15)
BUN: 9 mg/dL (ref 6–20)
CHLORIDE: 107 mmol/L (ref 101–111)
CO2: 27 mmol/L (ref 22–32)
Calcium: 8.8 mg/dL — ABNORMAL LOW (ref 8.9–10.3)
Creatinine, Ser: 0.78 mg/dL (ref 0.61–1.24)
Glucose, Bld: 104 mg/dL — ABNORMAL HIGH (ref 65–99)
POTASSIUM: 4 mmol/L (ref 3.5–5.1)
SODIUM: 140 mmol/L (ref 135–145)

## 2017-03-13 LAB — CBC
HCT: 35.5 % — ABNORMAL LOW (ref 39.0–52.0)
HEMOGLOBIN: 11.4 g/dL — AB (ref 13.0–17.0)
MCH: 31.8 pg (ref 26.0–34.0)
MCHC: 32.1 g/dL (ref 30.0–36.0)
MCV: 98.9 fL (ref 78.0–100.0)
PLATELETS: 57 10*3/uL — AB (ref 150–400)
RBC: 3.59 MIL/uL — AB (ref 4.22–5.81)
RDW: 15.9 % — ABNORMAL HIGH (ref 11.5–15.5)
WBC: 2.6 10*3/uL — ABNORMAL LOW (ref 4.0–10.5)

## 2017-03-13 NOTE — Progress Notes (Signed)
Report called to Raven RN, all patient belongings gathered and transfered with patient. VS stable prior to transfer.

## 2017-03-13 NOTE — Progress Notes (Signed)
Family Medicine Teaching Service Daily Progress Note Intern Pager: [phone]  Patient name: Alex RidingBilly Michael Giacobbe Jr. Medical record number: [id number] Date of birth: April 10, 1976 Age: 41 y.o. Gender: male  Primary Care Provider: Patient, No Pcp Per Consultants: Neurology  Code Status: Full   Pt Overview and Major Events to Date:  Admitted to FMTS on 5/27  Assessment and Plan: Alex RidingBilly Michael [place] Jr. is a 41 y.o. male presenting with seizure like activity . PMH is significant for alcoholic cirrhosis, Apollo disorder (unclear), homelessness  #Seizure-like activity, chronic, resolved Patient with a history of seizures presented with witnessed seizure like events. Initial  Head CT was negative for any acute findings. EEG done with final read negative for any seizure activity.  Neurology recommends Keppra for seizures.  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 Patient was placed in a negative pressure room. Patient has no recollection of ever being exposed or diagnosed with TB though had multiple risk factors (incarceration and homelessness). Seen by ID yesterday you recommend follow up with Health department for latent TB treatment given negative CXR and lack of symptoms. --Discontinue precaution --Follow up with Health department   #Alcohol abuse, chronic Patient was agitated overnight, likely secondary to EtOH withdrawal. Patient was finally able to settle down this morning. CIWA scores overnight have been 6>4>3>2. High risk for severe alcohol withdrawal given history of alcohol abuse. 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 Degenerative cervical spine  superimposed on congenital canal narrowing. Mild canal stenosis C5-6. Neural foraminal narrowing C3-4 thru C6-7: Moderate on the RIGHT at C3-4 and C6-7. --Will follow up with Neurology appreciate recs  #SVT, resolved Patient with an episode of SVT into the 180 on 5/29, self resolved after an episode of valsalva. Patient continued to have mild tachycardia requiring IV metoprolol. Appears to have resolved.  --Will continue Metoprolol 5 mg bid during withdrawal period.  #Hyponatremia, resolved   This morning Na is 140 most likely secondary to beer potomania. --Follow up on BMP  #Abdominal pain/alcoholiccirrhosis US consistent with cirrhosis, with improving transaminitis that was consistent with alcohol abuse. Bilirubin in 3.0 this morning up from 2.0. Hepatitis panel is negative --Continue Lactulose 30g 3 times a day --Continue to Trend LFTs   #Thrombocytopenia, improving Platelets continue to be low but stable. Likely splenic sequestration from his cirrhosis. 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 No evidence of active bleed.  --Follow up on pathology smear --Follow up on daily CBC  #Leukopenia, Stable WBC 3.8 on admission, 4.0 this AM continue to improving . Likely secondary to alcohol abuse. Hemoglobin 11.4, with mildly elevated MCV to 97% --Follow up on daily CBC  #Homelessness:  Patient was seen by SW yesterday and patient is interested in getting resources for local shelters. Patient also has an hospital follow up [facility] 6/22. --Follow up with SW, appreciate recs.  FEN/GI: Regular Diet PPx: SCD  Disposition: Still under withdrawal protocol, will continue to monitor. LTB not current concerns. Anticipate discharge in tomorrow.  Subjective:  Patient is feeling better this morning, still reports some anxiety but is much improved since admission.  Objective: Temp:  [98.5 F (36.9 C)-99.5 F (37.5 C)] 98.8 F (37.1 C) (06/01 0420) Pulse  Rate:  [76-95] 76 (05/31  2351) Resp:  [12-15] 12 (05/31 2351) BP: (102-125)/(74-87) 114/78 (05/31 2351) SpO2:  [91 %-96 %] 96 % (05/31 2351)   Physical Exam: General: Sleepy/ reserved, flat affect, NAD,  able to participate in exam Cardiac: RRR, normal heart sounds, no murmurs. 2+ radial and PT pulses bilaterally Respiratory: CTAB, normal effort, No wheezes, rales or rhonchi Abdomen: soft, nontender, nondistended, no hepatic or splenomegaly, +BS Extremities: no edema or cyanosis. WWP. Bilateral fourth and fifth digit numbness and tingling. Skin: warm and dry, no rashes noted Neuro: alert and oriented x4, no focal deficits Psych: Normal affect and mood Laboratory:  Recent Labs Lab 03/11/17 0235 03/12/17 0541 03/13/17 0259  WBC 4.0 3.0* 2.6*  HGB 12.3* 11.9* 11.4*  HCT 37.0* 35.8* 35.5*  PLT 30* 44* 57*    Recent Labs Lab 03/10/17 0337 03/11/17 0648 03/12/17 0541 03/13/17 0259  NA 139 137 139 140  K 3.7 3.8 3.7 4.0  CL 102 101 106 107  CO2 26 28 24 27   BUN 7 7 8 9   CREATININE 0.69 0.68 0.67 0.78  CALCIUM 9.5 9.1 8.7* 8.8*  PROT 7.7 7.2 6.6  --   BILITOT 3.0* 3.1* 2.6*  --   ALKPHOS 279* 274* 236*  --   ALT 100* 91* 85*  --   AST 174* 127* 109*  --   GLUCOSE 103* 97 100* 104*    Imaging/Diagnostic Tests: Mr Cervical Spine Wo Contrast  Result Date: 03/10/2017 CLINICAL DATA:  Witnessed seizure-like activity. History of alcohol abuse and extremity nephropathy. EXAM: MRI CERVICAL SPINE WITHOUT CONTRAST TECHNIQUE:  IMPRESSION: No fracture or malalignment. 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 Mild canal stenosis C5-6. Neural foraminal narrowing C3-4 thru C6-7: Moderate on the RIGHT at C3-4 and C6-7. Electronically Signed   By: Awilda Metro M.D.   On: 03/10/2017 22:11    Dg Chest 2 View  IMPRESSION: Suboptimal inspiration.  No acute cardiopulmonary process. Electronically Signed   By: Carey Bullocks M.D.   On: 03/08/2017 13:32    Ct Head Wo Contrast  Result Date: 03/08/2017 CLINICAL DATA:  41 year old male with a history of seizure and fall EXAM: CT HEAD WITHOUT CONTRAST CT CERVICAL SPINE WITHOUT CONTRAST TECHNIQUE:  IMPRESSION: Head CT: No CT evidence of acute intracranial abnormality. Cervical CT: No CT evidence of acute fracture or malalignment of the cervical spine. Early degenerative disc disease worst at the C6-C7 level. Electronically Signed   By: Gilmer Mor D.O.   On: 03/08/2017 14:14   Ct Cervical Spine Wo Contrast  Result Date: 03/08/2017 CLINICAL DATA:  41 year old male with a history of seizure and fall EXAM: CT HEAD WITHOUT CONTRAST CT CERVICAL SPINE WITHOUT CONTRAST TECHNIQUE: IMPRESSION: Head CT: No CT evidence of acute intracranial abnormality. Cervical CT: No CT evidence of acute fracture or malalignment of the cervical spine. Early degenerative disc disease worst at the C6-C7 level. Electronically Signed   By: Gilmer Mor D.O.   On: 03/08/2017 14:14   US Abdomen Complete  Result Date: 03/09/2017 CLINICAL DATA:  Cirrhosis and elevated LFTs EXAM: ABDOMEN ULTRASOUND COMPLETE COMPARISON: IMPRESSION: Somewhat limited exam although no acute abnormality is noted. Changes in the liver consistent with the given clinical history of cirrhosis. Electronically Signed   By: Alcide Clever M.D.   On: 03/09/2017 07:24    Lovena Neighbours, MD 03/13/2017, 6:57 AM PGY-1, [facility] FPTS Intern pager: [phone], text pages welcome

## 2017-03-14 DIAGNOSIS — F10129 Alcohol abuse with intoxication, unspecified: Secondary | ICD-10-CM

## 2017-03-14 LAB — CBC
HEMATOCRIT: 36.6 % — AB (ref 39.0–52.0)
Hemoglobin: 11.8 g/dL — ABNORMAL LOW (ref 13.0–17.0)
MCH: 32 pg (ref 26.0–34.0)
MCHC: 32.2 g/dL (ref 30.0–36.0)
MCV: 99.2 fL (ref 78.0–100.0)
Platelets: 91 10*3/uL — ABNORMAL LOW (ref 150–400)
RBC: 3.69 MIL/uL — AB (ref 4.22–5.81)
RDW: 15.7 % — ABNORMAL HIGH (ref 11.5–15.5)
WBC: 2.9 10*3/uL — ABNORMAL LOW (ref 4.0–10.5)

## 2017-03-14 LAB — BASIC METABOLIC PANEL
ANION GAP: 8 (ref 5–15)
BUN: 8 mg/dL (ref 6–20)
CO2: 25 mmol/L (ref 22–32)
Calcium: 8.6 mg/dL — ABNORMAL LOW (ref 8.9–10.3)
Chloride: 104 mmol/L (ref 101–111)
Creatinine, Ser: 0.77 mg/dL (ref 0.61–1.24)
GFR calc non Af Amer: >60 mL/min (ref >60)
Glucose, Bld: 89 mg/dL (ref 65–99)
Potassium: 4.1 mmol/L (ref 3.5–5.1)
Sodium: 137 mmol/L (ref 135–145)

## 2017-03-14 MED ORDER — FOLIC ACID 1 MG PO TABS: 1.0000 mg | ORAL_TABLET | Freq: Every day | ORAL | 0 refills | Status: AC

## 2017-03-14 MED ORDER — THIAMINE HCL 100 MG PO TABS: 100.0000 mg | ORAL_TABLET | Freq: Every day | ORAL | 0 refills | Status: AC

## 2017-03-14 MED ORDER — METOPROLOL TARTRATE 25 MG PO TABS: 12.5000 mg | ORAL_TABLET | Freq: Two times a day (BID) | ORAL | 0 refills | Status: AC

## 2017-03-14 MED ORDER — LEVETIRACETAM 500 MG PO TABS: 500.0000 mg | ORAL_TABLET | Freq: Two times a day (BID) | ORAL | 0 refills | Status: AC

## 2017-03-14 MED ORDER — GABAPENTIN 600 MG PO TABS: 300.0000 mg | ORAL_TABLET | Freq: Every day | ORAL | 0 refills | Status: AC

## 2017-03-14 MED ORDER — ADULT MULTIVITAMIN W/MINERALS CH: 1.0000 | ORAL_TABLET | Freq: Every day | ORAL | 0 refills | Status: AC

## 2017-03-14 MED ORDER — LACTULOSE 10 GM/15ML PO SOLN: 30.0000 g | Freq: Three times a day (TID) | ORAL | 0 refills | Status: AC

## 2017-03-14 NOTE — Progress Notes (Signed)
Family Medicine Teaching Service Daily Progress Note Intern Pager: [phone]  Patient name: Alex Klein. Medical record number: [id number] Date of birth: [birth year] Age: 41 y.o. Gender: male  Primary Care Provider: Patient, No Pcp Per Consultants: Neurology, ID  Code Status: Full   Pt Overview and Major Events to Date:  Admitted to FMTS on 5/27  Assessment and Plan: Alex Klein. is a 41 y.o. male presenting with seizure like activity . PMH is significant for alcoholic cirrhosis, Apollo disorder (unclear), homelessness.  #Seizure-like activity, chronic, resolved Patient with a history of seizures presented with witnessed seizure like events. Initial  Head CT was negative for any acute findings. EEG done with final read negative for any seizure activity.  Neurology recommends Keppra for seizures.  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 Patient was placed in a negative pressure room. Patient has no recollection of ever being exposed or diagnosed with TB though had multiple risk factors (incarceration and homelessness). Seen by ID yesterday who recommend follow up with health department for latent TB treatment given negative CXR and lack of symptoms. --Follow up with health department outpatient  #Alcohol abuse, chronic Elevated CIWA overnight to 10, though patient attributes to anxiety attack rather than EtOH withdrawal. CIWA scores overnight 0>10>3. Symptoms resolved this AM. High risk for severe alcohol withdrawal given history of alcohol abuse. 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 Mild canal stenosis C5-6. Neural foraminal narrowing C3-4 thru C6-7: Moderate on  the RIGHT at C3-4 and C6-7. --Will follow up with Neurology outpatient  #SVT, resolved Patient with an episode of SVT into the 180 on 5/29, self resolved after an episode of valsalva. Patient continued to have mild tachycardia requiring IV metoprolol. HR WNL overnight.  --Transition to PO metoprolol 12.5mg  BID  #Hyponatremia, resolved Most likely secondary to beer potomania. Na WNL at 137 this AM.  --AM BMP  #Abdominal pain/alcoholiccirrhosis [place] consistent with cirrhosis, with improving transaminitis that was consistent with alcohol abuse. Hepatitis panel is negative. --Continue Lactulose 30g 3 times a day  #Thrombocytopenia, improving Platelets continue to be low but stable. Likely splenic sequestration from his cirrhosis. 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 No evidence of active bleed. Platelets increased from 57 yesterday to 91 this AM. --Daily CBC  #Leukopenia, Stable WBC 3.8 on admission, decreased to 2.9 this AM. Likely secondary to alcohol abuse. Hemoglobin stable at 11.8. --Daily CBC  #Homelessness:  Patient was seen by SW yesterday and patient is interested in getting resources for local shelters. Patient also has an hospital follow up with [facility] on 6/22. --Follow up with SW, appreciate recs.  FEN/GI: Regular Diet PPx: SCD  Disposition: Discharge today  Subjective:  Patient with CIWA to 10 last night requiring Ativan, however patient reports this AM that symptoms were due to anxiety attack rather than EtOH withdrawal. Says he often has anxiety attacks for which he takes hydroxyzine at home, and symptoms last night felt similar to prior episodes. Says he is not experiencing symptoms of EtOH withdrawal and feels well enough to go home.   Objective: Temp:  [98.3 F (36.8 C)-99.5 F (37.5 C)] 98.3 F (36.8 C) (06/02 0417) Pulse Rate:  [80-90] 89 (06/02 0417) Resp:  [14-18] 17 (06/02 0417) BP: (114-153)/(70-86) 137/82 (06/02 0417) SpO2:   [  96 %-99 %] 99 % (06/02 0417) Weight:  [233 lb 0.4 oz (105.7 kg)] 233 lb 0.4 oz (105.7 kg) (06/01 1747)   Physical Exam: General: Sitting up in bed in NAD Cardiac: RRR, normal heart sounds, no murmurs appreciated Respiratory: CTAB, normal effort, no wheezes, rales or rhonchi Abdomen: soft, mild TTP, non-distended, +BS Skin: warm and dry, no rashes noted Neuro: alert and oriented x4, no focal deficits Psych: Flat affect and appropriate mood Laboratory:  Recent Labs Lab 03/12/17 0541 03/13/17 0259 03/14/17 0559  WBC 3.0* 2.6* 2.9*  HGB 11.9* 11.4* 11.8*  HCT 35.8* 35.5* 36.6*  PLT 44* 57* 91*    Recent Labs Lab 03/10/17 0337 03/11/17 0648 03/12/17 0541 03/13/17 0259 03/14/17 0559  NA 139 137 139 140 137  K 3.7 3.8 3.7 4.0 4.1  CL 102 101 106 107 104  CO2 26 28 24 27 25   BUN 7 7 8 9 8   CREATININE 0.69 0.68 0.67 0.78 0.77  CALCIUM 9.5 9.1 8.7* 8.8* 8.6*  PROT 7.7 7.2 6.6  --   --   BILITOT 3.0* 3.1* 2.6*  --   --   ALKPHOS 279* 274* 236*  --   --   ALT 100* 91* 85*  --   --   AST 174* 127* 109*  --   --   GLUCOSE 103* 97 100* 104* 89    Imaging/Diagnostic Tests: Mr Cervical Spine Wo Contrast  Result Date: 03/10/2017 CLINICAL DATA:  Witnessed seizure-like activity. History of alcohol abuse and extremity nephropathy. EXAM: MRI CERVICAL SPINE WITHOUT CONTRAST TECHNIQUE:  IMPRESSION: No fracture or malalignment. 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 Mild canal stenosis C5-6. Neural foraminal narrowing C3-4 thru C6-7: Moderate on the RIGHT at C3-4 and C6-7. Electronically Signed   By: Awilda Metroourtnay  Bloomer M.D.   On: 03/10/2017 22:11    Dg Chest 2 View  IMPRESSION: Suboptimal inspiration.  No acute cardiopulmonary process. Electronically Signed   By: Carey BullocksWilliam  Veazey M.D.   On: 03/08/2017 13:32   Ct Head Wo Contrast  Result Date: 03/08/2017 CLINICAL DATA:  41 year old male with a history of seizure and fall EXAM: CT HEAD WITHOUT CONTRAST  CT CERVICAL SPINE WITHOUT CONTRAST TECHNIQUE:  IMPRESSION: Head CT: No CT evidence of acute intracranial abnormality. Cervical CT: No CT evidence of acute fracture or malalignment of the cervical spine. Early degenerative disc disease worst at the C6-C7 level. Electronically Signed   By: Gilmer MorJaime  Wagner D.O.   On: 03/08/2017 14:14   Ct Cervical Spine Wo Contrast  Result Date: 03/08/2017 CLINICAL DATA:  41 year old male with a history of seizure and fall EXAM: CT HEAD WITHOUT CONTRAST CT CERVICAL SPINE WITHOUT CONTRAST TECHNIQUE: IMPRESSION: Head CT: No CT evidence of acute intracranial abnormality. Cervical CT: No CT evidence of acute fracture or malalignment of the cervical spine. Early degenerative disc disease worst at the C6-C7 level. Electronically Signed   By: Gilmer MorJaime  Wagner D.O.   On: 03/08/2017 14:14   [place] Abdomen Complete  Result Date: 03/09/2017 CLINICAL DATA:  Cirrhosis and elevated LFTs EXAM: ABDOMEN ULTRASOUND COMPLETE COMPARISON: IMPRESSION: Somewhat limited exam although no acute abnormality is noted. Changes in the liver consistent with the given clinical history of cirrhosis. Electronically Signed   By: Alcide CleverMark  Lukens M.D.   On: 03/09/2017 07:24    [place] S[place], Kamsiyochukwu Spickler Joseph, MD 03/14/2017, 9:49 AM PGY-2, [facility] FPTS Intern pager: [phone], text pages welcome

## 2017-03-14 NOTE — Discharge Summary (Signed)
Family Medicine Teaching [facility] Discharge Summary  Patient name: Alex Klein. Medical record number: [id number] Date of birth: 1976-09-24 Age: 41 y.o. Gender: male Date of Admission: 03/08/2017  Date of Discharge: 03/14/17 Admitting Physician: Uvaldo Rising, MD  Primary Care Provider: Patient, No Pcp Per Consultants: Neurology, ID  Indication for Hospitalization: seizure-like activity  Discharge Diagnoses/Problem List:  Patient Active Problem List   Diagnosis Date Noted  . Cirrhosis (HCC)   . TB lung, latent   . Alcohol withdrawal syndrome without complication (HCC)   . Hyperammonemia (HCC)   . Neuropathy   . Alcoholic cirrhosis (HCC)   . Encephalopathy   . Thrombocytopenia (HCC)   . Seizure-like activity (HCC) 03/08/2017     Disposition: Home  Discharge Condition: Stable  Discharge Exam:  General: Sitting up in bed in NAD Cardiac: RRR, normal heart sounds, no murmurs appreciated Respiratory: CTAB, normal effort, no wheezes, rales or rhonchi Abdomen: soft, mild TTP, non-distended, +BS Skin: warm and dry, no rashes noted Neuro: alert and oriented x4, no focal deficits Psych: Flat affect and appropriate mood  Brief Hospital Course:  Patient was admitted after reporting seizure-like activity. Patient has history of seizure disorder but is non-compliant with medications prescribed to him by former PCP in [place]. He called EMS after episodes of "passing out" which he reports is similar to his typical seizures. He was also noted to have alcohol intoxication upon admission, and reported drinking 18-20 twelve oz beers per day. He was subsequently admitted for further work-up.  Neurology was consulted for further work-up of seizure-like activity. CT head was performed without acute findings. EEG also negative for seizure activity. Neurology recommended Keppra 500mg  BID with outpatient f/u. Patient had no additional seizure activity after admission.  EtOH was  elevated to 522 with transaminitis also noted on admission. He was admitted to stepdown unit for monitoring of possible alcohol withdrawal and started on thiamine and folic acid. Also noted to have hyponatremia likely secondary to beer potomania which improved throughout admission. Patient did require Ativan once due to CIWA score 10, however patient says that the symptoms he experienced at this time were due to an anxiety attack rather than to alcohol withdrawal. He frequently has anxiety attacks and said the symptoms were the same as his usual episodes. As such, he was not kept longer for further monitoring, as subsequent CIWA scores were very low.  Patient also noted to have positive quant gold during admission. CXR was negative and patient asymptomatic. ID was consulted, who felt that, as such, he was stable for outpatient follow-up with the health department for treatment for latent Tb.   Issues for Follow Up:  1. Patient discharged on following medications: - Gabapentin 300mg  qhs - Lactulose 45mL TID - Keppra 500mg  BID - Metoprolol 12.5mg  BID - Multivitamin - Thiamine - Folate 2.   Patient started on metoprolol during admission for tachycardia thought to be due to withdrawals. If patient's HR WNL at f/u, strongly consider weaning dose.  3.   Please ensure patient has schedule appt at health department for treatment of latent Tb.  4.   Please ensure patient has scheduled f/u appt with [facility].   Significant Procedures: None  Significant Labs and Imaging:   Recent Labs Lab 03/12/17 0541 03/13/17 0259 03/14/17 0559  WBC 3.0* 2.6* 2.9*  HGB 11.9* 11.4* 11.8*  HCT 35.8* 35.5* 36.6*  PLT 44* 57* 91*    Recent Labs Lab 03/08/17 1224 03/08/17 1816 03/08/17 1955 03/09/17  0234 03/10/17 [id number] 03/10/17 1003 03/11/17 0235 03/11/17 [id number] 03/12/17 0541 03/13/17 0259 03/14/17 0559  NA 134*  --   --  143 139  --   --  137 139 140 137  K 3.9  --   --  3.7 3.7  --   --  3.8 3.7  4.0 4.1  CL 99*  --   --  110 102  --   --  101 106 107 104  CO2 24  --   --  24 26  --   --  28 24 27 25   GLUCOSE 102*  --   --  90 103*  --   --  97 100* 104* 89  BUN <5*  --   --  <5* 7  --   --  7 8 9 8   CREATININE 0.59* 0.58*  --  0.62 0.69  --   --  0.68 0.67 0.78 0.77  CALCIUM 8.5*  --   --  8.0* 9.5  --   --  9.1 8.7* 8.8* 8.6*  MG 1.8 1.7  --   --  1.6*  --  1.9  --   --   --   --   PHOS  --  4.8* 4.7*  --   --  2.8  --   --   --   --   --   ALKPHOS 253*  --   --  218* 279*  --   --  274* 236*  --   --   AST 198*  --   --  227* 174*  --   --  127* 109*  --   --   ALT 105*  --   --  97* 100*  --   --  91* 85*  --   --   ALBUMIN 4.0  --   --  3.5 4.2  --   --  3.8 3.5  --   --     Mr Cervical Spine Wo Contrast  Result Date: 03/10/2017 CLINICAL DATA:  Witnessed seizure-like activity. History of alcohol abuse and extremity nephropathy. EXAM: MRI CERVICAL SPINE WITHOUT CONTRAST TECHNIQUE: Multiplanar, multisequence MR imaging of the cervical spine was performed. No intravenous contrast was administered. COMPARISON:  CT cervical spine Mar 08, 2017 FINDINGS: ALIGNMENT: Straightened cervical lordosis.  No malalignment. VERTEBRAE/DISCS: Vertebral bodies are intact. Mild C6-7 disc height loss with moderate subacute chronic discogenic endplate changes. Remaining disc morphology is preserved. Minimal chronic discogenic endplate changes C4-5. No abnormal or acute bone marrow signal. Congenital canal narrowing on the basis of foreshortened pedicles. CORD:Cervical spinal cord is normal morphology and signal characteristics from the cervicomedullary junction to level of T2-3, the most caudal well visualized level. POSTERIOR FOSSA, VERTEBRAL ARTERIES, PARASPINAL TISSUES: No MR findings of ligamentous injury. Vertebral artery flow voids present. Included posterior fossa and paraspinal soft tissues are normal. DISC LEVELS (axial sequences do not cross reference to sagittal sequences, assessment limited as  laterality cannot be determined): C2-3: Small LEFT central disc protrusion. No canal stenosis or neural foraminal narrowing. C3-4: Small LEFT central disc protrusion. Uncovertebral hypertrophy. No canal stenosis. Moderate RIGHT neural foraminal narrowing. C4-5: Uncovertebral hypertrophy. Mild RIGHT grand LEFT facet arthropathy. No canal stenosis. Mild bilateral neural foraminal narrowing. C5-6: Small RIGHT central disc protrusion, uncovertebral hypertrophy and mild RIGHT facet arthropathy. Mild canal stenosis. Mild RIGHT, mild to moderate LEFT neural foraminal narrowing. C6-7: Annular bulging, uncovertebral hypertrophy. No canal stenosis. Moderate RIGHT, mild to moderate LEFT neural foraminal  narrowing. C7-T1: Tiny RIGHT central disc protrusion without canal stenosis or neural foraminal narrowing. IMPRESSION: No fracture or malalignment. 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 Mild canal stenosis C5-6. Neural foraminal narrowing C3-4 thru C6-7: Moderate on the RIGHT at C3-4 and C6-7. Electronically Signed   By: Awilda Metro M.D.   On: 03/10/2017 22:11     Results/Tests Pending at Time of Discharge: None  Discharge Medications:  Allergies as of 03/14/2017   No Known Allergies     Medication List    TAKE these medications   folic acid 1 MG tablet Commonly known as:  FOLVITE Take 1 tablet (1 mg total) by mouth daily. Start taking on:  03/15/2017   gabapentin 600 MG tablet Commonly known as:  NEURONTIN Take 0.5 tablets (300 mg total) by mouth at bedtime.   lactulose 10 GM/15ML solution Commonly known as:  CHRONULAC Take 45 mLs (30 g total) by mouth 3 (three) times daily.   levETIRAcetam 500 MG tablet Commonly known as:  KEPPRA Take 1 tablet (500 mg total) by mouth 2 (two) times daily.   metoprolol tartrate 25 MG tablet Commonly known as:  LOPRESSOR Take 0.5 tablets (12.5 mg total) by mouth 2 (two) times daily.   multivitamin with minerals Tabs  tablet Take 1 tablet by mouth daily. Start taking on:  03/15/2017   thiamine 100 MG tablet Take 1 tablet (100 mg total) by mouth daily. Start taking on:  03/15/2017       Discharge Instructions: Please refer to Patient Instructions section of EMR for full details.  Patient was counseled important signs and symptoms that should prompt return to medical care, changes in medications, dietary instructions, activity restrictions, and follow up appointments.   Follow-Up Appointments: Follow-up Information    [facility] Follow up on 04/03/2017.   Why:  10:30 am for hospital follow up Contact information: [address] [place] [address] [phone]       Department, [facility]. Go to.   Why:  Follow up for future treatment of latent TB infection.  Contact information: [address] [place] [address] [phone]        Tillman Sers, DO. Go on 03/18/2017.   Why:  At 2:45PM for hospital follow-up appointment Contact information: [address] [place] [address] [phone]           Marquette Saa, MD 03/14/2017, 12:10 PM PGY-2, [facility]

## 2017-03-14 NOTE — Discharge Instructions (Signed)
Please take the following medications: - Keppra 500mg  (one tablet) twice a day (for seizures) - Metoprolol 12.5mg  (one-half tablet) twice a day (for fast heart rate) - Multivitamin one tablet a day - Thiamine one tablet a day - Folic acid (folate) one tablet a day - Lactulose 45mL three times a day (every 8 hours) (for constipation and to help your liver) - Gabapentin 300mg  (one half tablet) at bedtime (for nerve pain)  If you have any more seizures, please call 911 or return to the emergency room right away.   You have an appointment with Dr. Wonda Oldsiccio at [facility] at [address] on Wednesday, June 6 at 2:45PM. It is VERY important that you come to this appointment so we can make changes to your medications if necessary.   It is also VERY important that you schedule an appointment at the health department for treatment of your tuberculosis. Their phone number is [phone].  Finally, please schedule an appointment with your neurologist Dr. Amada JupiterKirkpatrick at [facility]. Their phone number is [phone].

## 2017-03-14 NOTE — Progress Notes (Signed)
[facility] Michael Gahm Jr. to be D/C'd from Hospital per MD order.  Discussed with the patient and all questions fully answered.  VSS, Skin clean, dry and intact without evidence of skin break down, no evidence of skin tears noted. IV catheter discontinued intact. Site without signs and symptoms of complications. Dressing and pressure applied.  An After Visit Summary was printed and given to the patient. Patient received prescription.  D/c education completed with patient/family including follow up instructions, medication list, d/c activities limitations if indicated, with other d/c instructions as indicated by MD - patient able to verbalize understanding, all questions fully answered.   Patient instructed to return to ED, call 911, or call MD for any changes in condition.   Patient escorted via WC, and D/C home via [facility] [employer].  Evern BioLoren D Shanan Mcmiller 03/14/2017 4:45 PM

## 2017-03-18 ENCOUNTER — Inpatient Hospital Stay: Payer: Self-pay | Admitting: Family Medicine

## 2017-04-03 ENCOUNTER — Inpatient Hospital Stay (INDEPENDENT_AMBULATORY_CARE_PROVIDER_SITE_OTHER): Payer: Self-pay | Admitting: Physician Assistant

## 2017-06-03 IMAGING — US US ABDOMEN COMPLETE
1 series · 14 of 25 positions shown · non-contrast
Comparison: None.

CLINICAL DATA: Cirrhosis and elevated LFTs

EXAM:
ABDOMEN ULTRASOUND COMPLETE

[Series 1: us abdomen complete · 0.28mm/px · 14 of 86 slices shown]
[im 1/86]
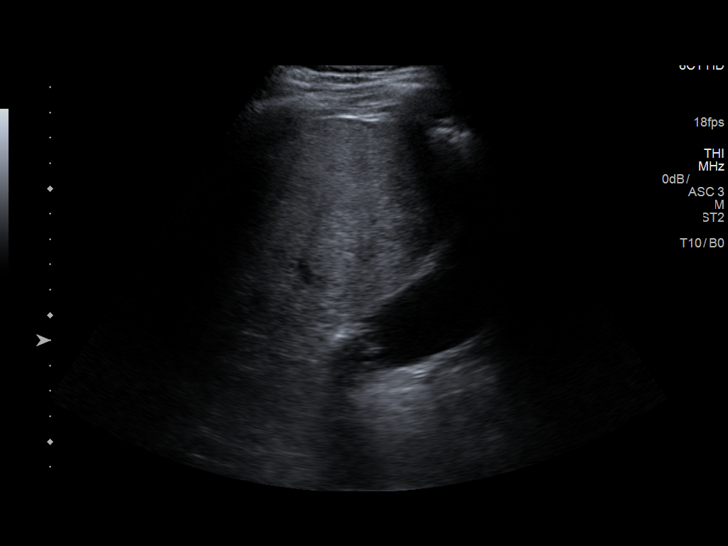
[im 8/86]
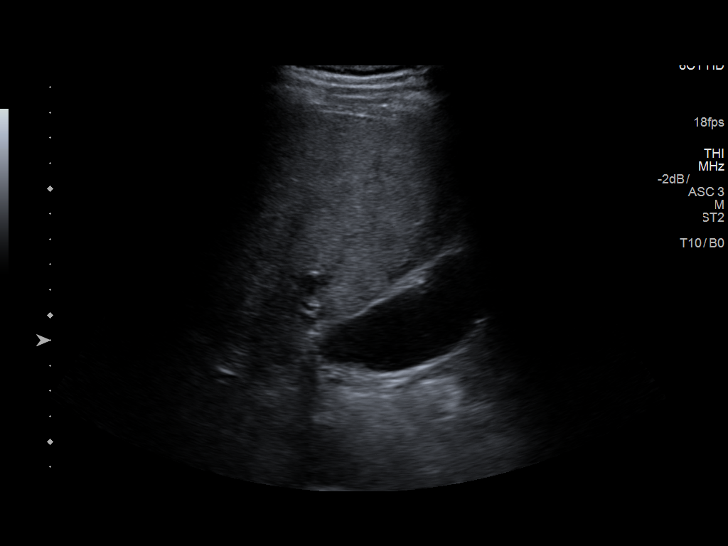
[im 15/86]
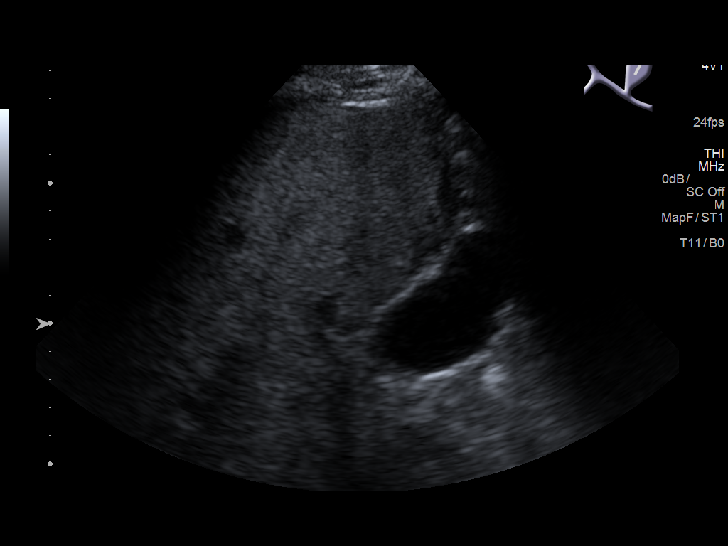
[im 22/86]
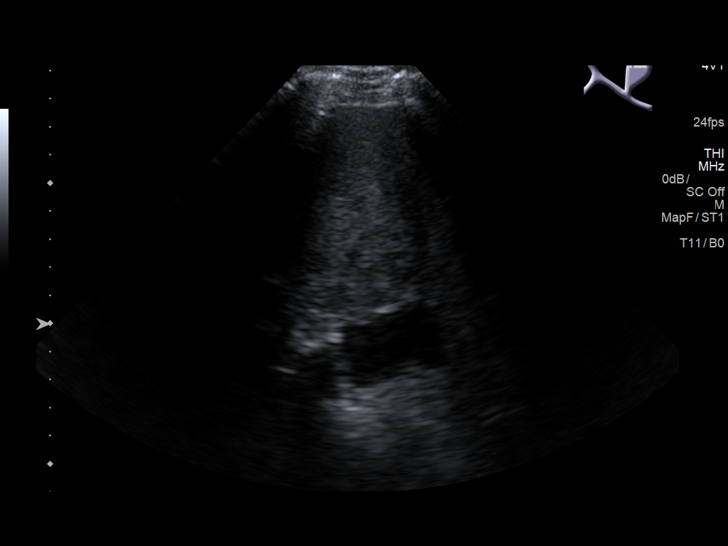
[im 29/86]
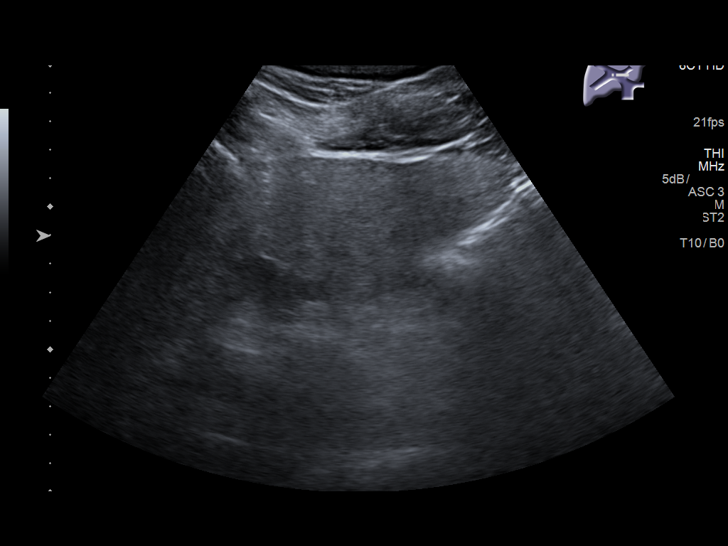
[im 32/86]
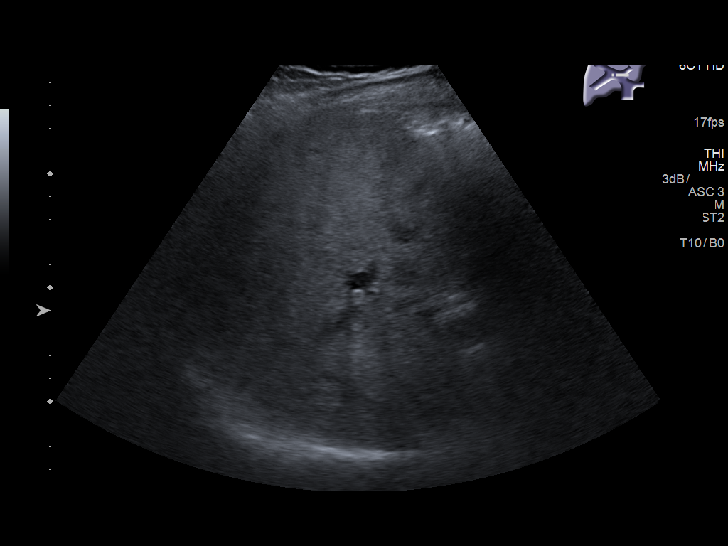
[im 39/86]
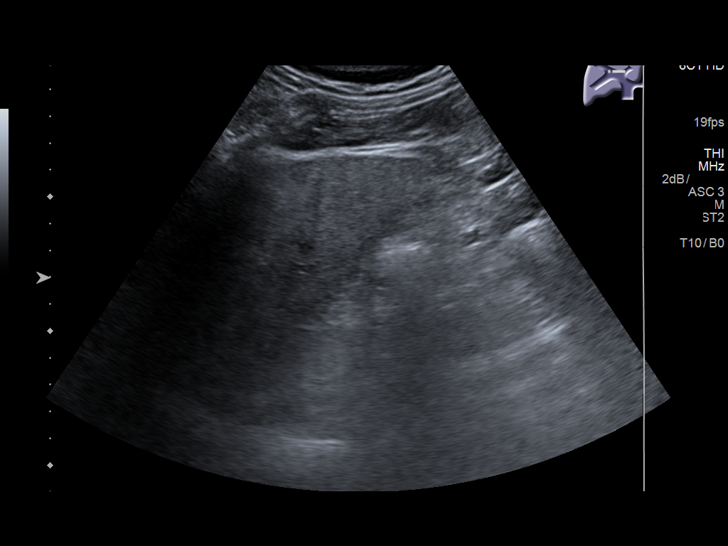
[im 47/86]
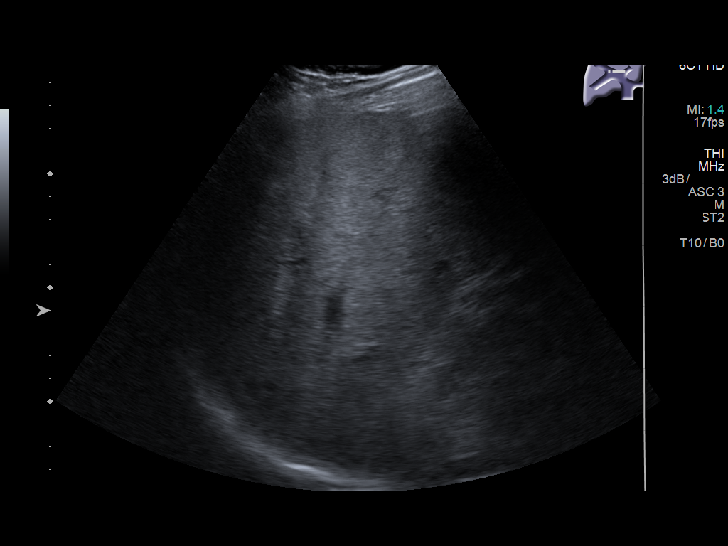
[im 54/86]
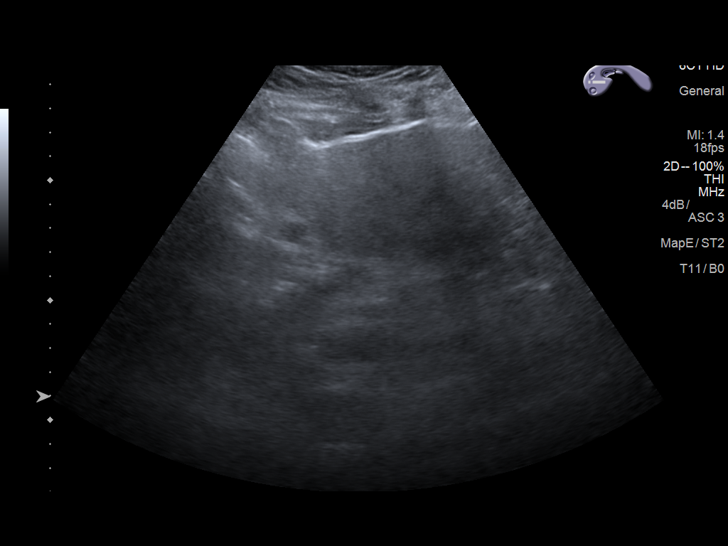
[im 57/86]
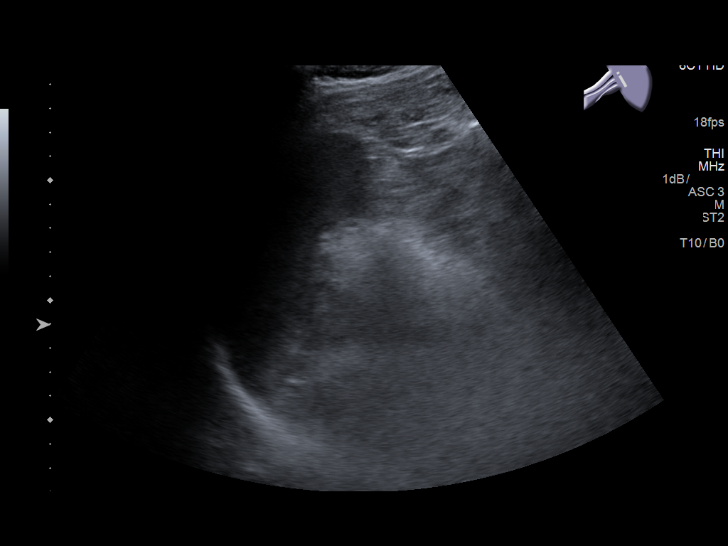
[im 64/86]
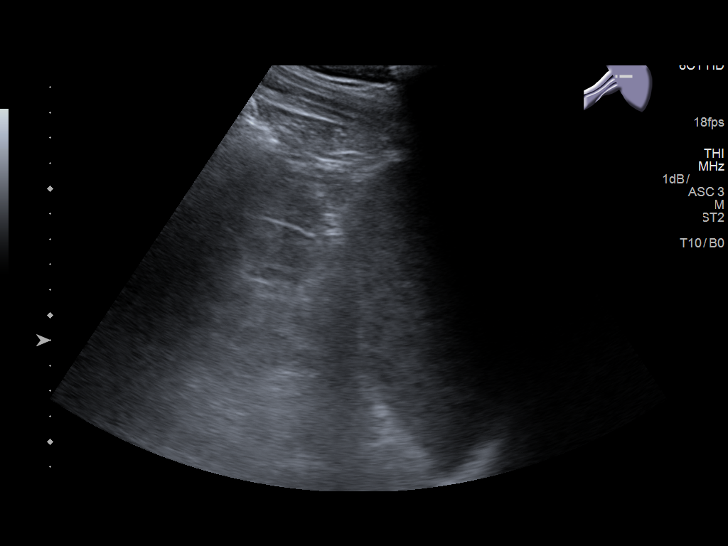
[im 71/86]
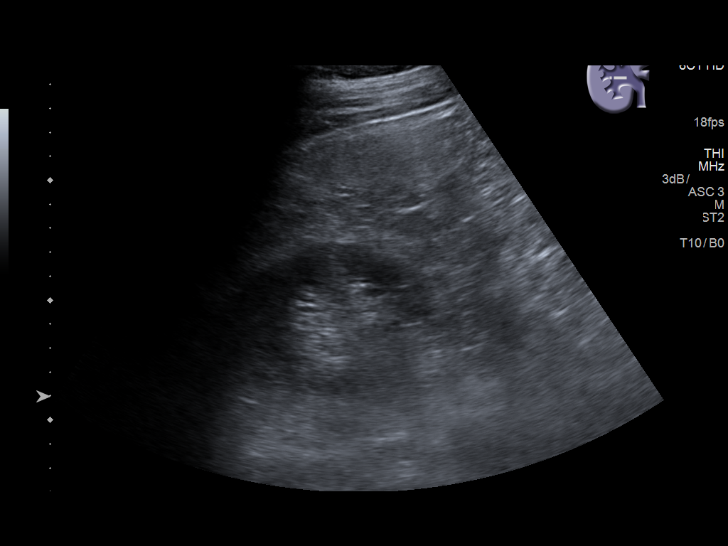
[im 78/86]
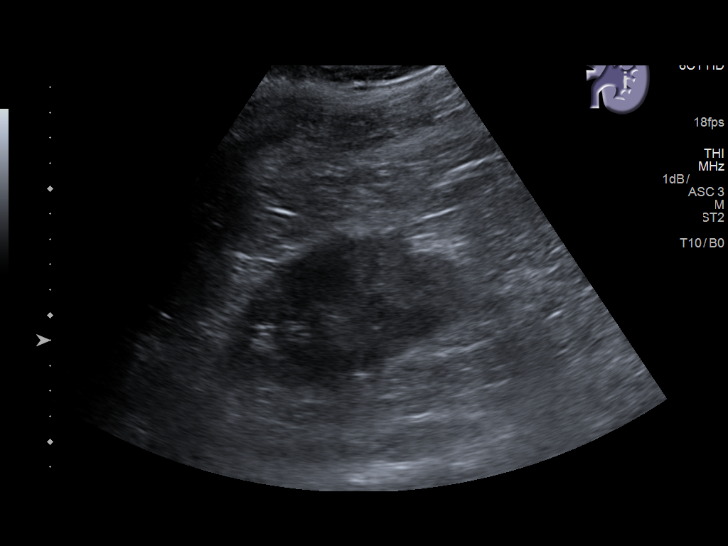
[im 86/86]
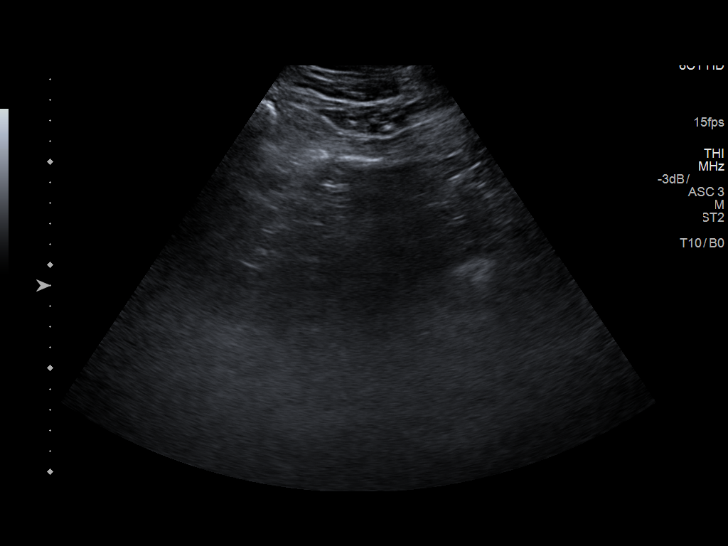

[14 of 25 positions shown; findings below may reference images not displayed]

FINDINGS: Gallbladder: No gallstones or wall thickening visualized. No
sonographic Murphy sign noted by sonographer.

Common bile duct: Diameter: 4 mm

Liver: Mild heterogeneity with increased echogenicity consistent
with the given clinical history.

IVC: No abnormality visualized.

Pancreas: Not well visualized due to overlying bowel gas.

Spleen: Size and appearance within normal limits.

Right Kidney: Length: 11.9 cm.. Echogenicity within normal limits.
No mass or hydronephrosis visualized.

Left Kidney: Length: 11.7 cm.. Echogenicity within normal limits. No
mass or hydronephrosis visualized.

Abdominal aorta: No aneurysm visualized.

Other findings: None.
IMPRESSION: Somewhat limited exam although no acute abnormality is noted.

Changes in the liver consistent with the given clinical history of
cirrhosis.

## 2022-06-13 DEATH — deceased
